# Patient Record
Sex: Male | Born: 1995 | Race: White | Hispanic: No | Marital: Single | Smoking: Never smoker
Health system: Southern US, Community
[De-identification: ages and names within clinical notes are randomized; demographics above are authoritative.]

## PROBLEM LIST (undated history)

## (undated) DIAGNOSIS — Z9889 Other specified postprocedural states: Secondary | ICD-10-CM

## (undated) DIAGNOSIS — R112 Nausea with vomiting, unspecified: Secondary | ICD-10-CM

## (undated) DIAGNOSIS — J45909 Unspecified asthma, uncomplicated: Secondary | ICD-10-CM

## (undated) DIAGNOSIS — H53001 Unspecified amblyopia, right eye: Secondary | ICD-10-CM

## (undated) HISTORY — PX: FRACTURE SURGERY: SHX138

## (undated) HISTORY — PX: EYE SURGERY: SHX253

---
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
## 2013-01-17 ENCOUNTER — Encounter (HOSPITAL_COMMUNITY): Payer: Self-pay | Admitting: Emergency Medicine

## 2013-01-17 ENCOUNTER — Emergency Department (INDEPENDENT_AMBULATORY_CARE_PROVIDER_SITE_OTHER)
Admission: EM | Admit: 2013-01-17 | Discharge: 2013-01-17 | Disposition: A | Discharge status: Home / Self Care | Payer: No Typology Code available for payment source | Source: Home / Self Care | Attending: Family Medicine | Admitting: Family Medicine

## 2013-01-17 DIAGNOSIS — Z760 Encounter for issue of repeat prescription: Secondary | ICD-10-CM

## 2013-01-17 MED ORDER — FLUTICASONE-SALMETEROL 115-21 MCG/ACT IN AERO: 2.0000 | INHALATION_SPRAY | Freq: Two times a day (BID) | RESPIRATORY_TRACT | Status: AC

## 2013-01-17 MED ORDER — ALBUTEROL SULFATE HFA 108 (90 BASE) MCG/ACT IN AERS: 1.0000 | INHALATION_SPRAY | Freq: Four times a day (QID) | RESPIRATORY_TRACT | Status: AC | PRN

## 2013-01-17 NOTE — Discharge Instructions (Signed)
Medication Refill, Emergency Department  We have refilled your medication today as a courtesy to you. It is best for your medical care, however, to take care of getting refills done through your primary caregiver's office. They have your records and can do a better job of follow-up than we can in the emergency department.  On maintenance medications, we often only prescribe enough medications to get you by until you are able to see your regular caregiver. This is a more expensive way to refill medications.  In the future, please plan for refills so that you will not have to use the emergency department for this.  Thank you for your help. Your help allows us to better take care of the daily emergencies that enter our department.  Document Released: 04/09/2003 Document Revised: 03/15/2011 Document Reviewed: 12/21/2004  ExitCare® Patient Information ©2014 [employer].

## 2013-01-17 NOTE — ED Provider Notes (Signed)
CSN: [id number]     Arrival date & time 01/17/13  1632 History   First MD Initiated Contact with Patient 01/17/13 1757     Chief Complaint  Patient presents with  . Medication Refill   (Consider location/radiation/quality/duration/timing/severity/associated sxs/prior Treatment) HPI Comments: Patient presents requesting routine refill of his asthma medications. [facility], however, he states they no longer accept his insurance. Denies any additional health issues. Is a non-smoker. Denies a current asthma exacerbation.  The history is provided by the patient.    History reviewed. No pertinent past medical history. No past surgical history on file. No family history on file. History  Substance Use Topics  . Smoking status: Not on file  . Smokeless tobacco: Not on file  . Alcohol Use: Not on file    Review of Systems  All other systems reviewed and are negative.    Allergies  Review of patient's allergies indicates no known allergies.  Home Medications  No current outpatient prescriptions on file. BP 135/88  Pulse 61  Temp(Src) 98.3 F (36.8 C) (Oral)  Resp 16  SpO2 100% Physical Exam  Nursing note and vitals reviewed. Constitutional: He is oriented to person, place, and time. He appears well-developed and well-nourished. No distress.  HENT:  Head: Normocephalic and atraumatic.  Nose: Nose normal.  Mouth/Throat: Oropharynx is clear and moist.  Eyes: Conjunctivae are normal. Right eye exhibits no discharge. Left eye exhibits no discharge. No scleral icterus.  Neck: Normal range of motion. Neck supple.  Cardiovascular: Normal rate, regular rhythm and normal heart sounds.   Pulmonary/Chest: Effort normal and breath sounds normal.  Abdominal: There is no tenderness.  Musculoskeletal: Normal range of motion.  Lymphadenopathy:    He has no cervical adenopathy.  Neurological: He is alert and oriented to person, place, and time.  Skin:  Skin is warm and dry.  Psychiatric: He has a normal mood and affect. His behavior is normal.    ED Course  Procedures (including critical care time) Labs Review Labs Reviewed - No data to display Imaging Review No results found.  EKG Interpretation    Date/Time:    Ventricular Rate:    PR Interval:    QRS Duration:   QT Interval:    QTC Calculation:   R Axis:     Text Interpretation:              MDM  Visit for medication refill. No acute illness.    Jess BartersJennifer Lee [place] Creek[place], [place] 01/17/13 [phone]

## 2013-01-17 NOTE — ED Notes (Signed)
C/o asthma flare up due to cold weather States he does need a refill on his advair inhaler

## 2013-01-18 NOTE — ED Provider Notes (Signed)
Medical screening examination/treatment/procedure(s) were performed by a resident physician or non-physician practitioner and as the supervising physician I was immediately available for consultation/collaboration.  Okie Bogacz, MD    Verity Gilcrest S Kealan Buchan, MD 01/18/13 0752 

## 2019-01-18 ENCOUNTER — Encounter (HOSPITAL_COMMUNITY): Payer: Self-pay

## 2019-01-18 ENCOUNTER — Ambulatory Visit (HOSPITAL_COMMUNITY)
Admission: EM | Admit: 2019-01-18 | Discharge: 2019-01-18 | Disposition: A | Discharge status: Home / Self Care | Payer: Commercial Managed Care - PPO | Source: Home / Self Care | Attending: Internal Medicine | Admitting: Internal Medicine

## 2019-01-18 ENCOUNTER — Other Ambulatory Visit: Payer: Self-pay

## 2019-01-18 DIAGNOSIS — L03115 Cellulitis of right lower limb: Secondary | ICD-10-CM | POA: Insufficient documentation

## 2019-01-18 DIAGNOSIS — L03315 Cellulitis of perineum: Secondary | ICD-10-CM | POA: Diagnosis not present

## 2019-01-18 DIAGNOSIS — L03119 Cellulitis of unspecified part of limb: Secondary | ICD-10-CM | POA: Diagnosis not present

## 2019-01-18 MED ORDER — HYDROCODONE-ACETAMINOPHEN 5-325 MG PO TABS: 1.0000 | ORAL_TABLET | Freq: Four times a day (QID) | ORAL | 0 refills | Status: DC | PRN

## 2019-01-18 MED ORDER — SULFAMETHOXAZOLE-TRIMETHOPRIM 800-160 MG PO TABS: 1.0000 | ORAL_TABLET | Freq: Two times a day (BID) | ORAL | 0 refills | Status: DC

## 2019-01-18 MED ORDER — LIDOCAINE HCL (PF) 2 % IJ SOLN
INTRAMUSCULAR | Status: AC
  Filled 2019-01-18: qty 5

## 2019-01-18 MED ORDER — IBUPROFEN 800 MG PO TABS: 800.0000 mg | ORAL_TABLET | Freq: Three times a day (TID) | ORAL | 0 refills | Status: AC | PRN

## 2019-01-18 NOTE — ED Provider Notes (Addendum)
Brian Richardson    CSN: [id number] Arrival date & time: 01/18/19  5397      History   Chief Complaint Chief Complaint  Patient presents with  . Knee Infection    HPI Brian Richardson is a 24 y.o. male with history of asthma comes to urgent care with complaint of painful swelling in the right lower thigh which started 5 days ago.  Patient was seen 2 days ago and was prescribed antibiotics (Augmentin) at another urgent care.  Patient symptoms did not improve hence the visit to our urgent care.  Says the pain is throbbing, 10 out of 10, constant, aggravated by movement, no relieving factors and pain.  No radiation of pain.No radiation of pain.  Patient had a fever and was tachycardic at the time of being seen here.    History reviewed. No pertinent past medical history.  Patient Active Problem List   Diagnosis Date Noted  . BRONCHITIS 02/20/2008  . ASTHMA 02/20/2008    History reviewed. No pertinent surgical history.     Home Medications    Prior to Admission medications   Medication Sig Start Date End Date Taking? Authorizing Provider  amoxapine (ASENDIN) 100 MG tablet Take by mouth 2 (two) times daily.   Yes [provider]  albuterol (PROVENTIL HFA;VENTOLIN HFA) 108 (90 BASE) MCG/ACT inhaler Inhale 1-2 puffs into the lungs every 6 (six) hours as needed for wheezing or shortness of breath. 01/17/13   Presson, Audelia Hives, PA  fluticasone-salmeterol (ADVAIR HFA) 673-41 MCG/ACT inhaler Inhale 2 puffs into the lungs 2 (two) times daily. 01/17/13   Presson, Audelia Hives, PA  HYDROcodone-acetaminophen (NORCO/VICODIN) 5-325 MG tablet Take 1 tablet by mouth every 6 (six) hours as needed for up to 5 days for moderate pain. 01/18/19 01/23/19  Chase Picket, MD  ibuprofen (ADVIL) 800 MG tablet Take 1 tablet (800 mg total) by mouth every 8 (eight) hours as needed for fever or moderate pain. 01/18/19   Gunnison Chahal, Myrene Galas, MD  sulfamethoxazole-trimethoprim (BACTRIM DS)  800-160 MG tablet Take 1 tablet by mouth 2 (two) times daily for 7 days. 01/18/19 01/25/19  Chase Picket, MD    Family History Family History  Problem Relation Age of Onset  . Healthy Mother   . Healthy Father     Social History Social History   Tobacco Use  . Smoking status: Never Smoker  . Smokeless tobacco: Never Used  Substance Use Topics  . Alcohol use: Yes  . Drug use: Never     Allergies   Patient has no known allergies.   Review of Systems Review of Systems   Physical Exam Triage Vital Signs ED Triage Vitals  Enc Vitals Group     BP 01/18/19 0828 (!) 150/59     Pulse Rate 01/18/19 0828 (!) 125     Resp 01/18/19 0828 18     Temp 01/18/19 0828 (!) 100.8 F (38.2 C)     Temp Source 01/18/19 0828 Oral     SpO2 01/18/19 0828 96 %     Weight 01/18/19 0824 235 lb 3.2 oz (106.7 kg)     Height --      Head Circumference --      Peak Flow --      Pain Score 01/18/19 0824 8     Pain Loc --      Pain Edu? --      Excl. in [place]? --    No data found.  Updated Vital Signs BP (!) 150/59 (BP Location: Right Arm)   Pulse (!) 125   Temp (!) 100.8 F (38.2 C) (Oral)   Resp 18   Wt 106.7 kg   SpO2 96%   Visual Acuity Right Eye Distance:   Left Eye Distance:   Bilateral Distance:    Right Eye Near:   Left Eye Near:    Bilateral Near:     Physical Exam Constitutional:      General: He is in acute distress.     Appearance: He is ill-appearing.  Cardiovascular:     Rate and Rhythm: Normal rate and regular rhythm.     Pulses: Normal pulses.     Heart sounds: Normal heart sounds.  Pulmonary:     Effort: Pulmonary effort is normal.     Breath sounds: Normal breath sounds.  Abdominal:     General: Bowel sounds are normal.     Palpations: Abdomen is soft.     Tenderness: There is no abdominal tenderness. There is no rebound.     Hernia: No hernia is present.  Musculoskeletal:        General: Normal range of motion.     Comments: Swelling on the  distal lateral aspect of the right thigh.  Patient had no knee joint swelling.  Patella was not ballotable.  Swelling was tender, indurated and had a necrotic roof over an abscess area.  Skin:    General: Skin is warm and dry.     Capillary Refill: Capillary refill takes less than 2 seconds.  Neurological:     General: No focal deficit present.     Mental Status: He is alert and oriented to person, place, and time.      UC Treatments / Results  Labs (all labs ordered are listed, but only abnormal results are displayed) Labs Reviewed  AEROBIC CULTURE (SUPERFICIAL SPECIMEN)    EKG   Radiology No results found.  Procedures Incision and Drainage  Date/Time: 01/19/2019 5:18 PM Performed by: Merrilee Jansky, MD Authorized by: Merrilee Jansky, MD   Consent:    Consent obtained:  Verbal   Consent given by:  Patient   Risks discussed:  Incomplete drainage   Alternatives discussed:  No treatment and delayed treatment Location:    Type:  Abscess   Location:  Lower extremity   Lower extremity location:  Knee   Knee location:  R knee Pre-procedure details:    Skin preparation:  Betadine Anesthesia (see MAR for exact dosages):    Anesthesia method:  Local infiltration   Local anesthetic:  Lidocaine 2% WITH epi Procedure type:    Complexity:  Simple Procedure details:    Incision types:  Single straight   Incision depth:  Subcutaneous   Scalpel blade:  11   Wound management:  Probed and deloculated   Drainage:  Bloody and purulent   Drainage amount:  Moderate   Wound treatment:  Drain placed   Packing materials:  1/4 in iodoform gauze   Amount 1/4" iodoform:  4 Post-procedure details:    Patient tolerance of procedure:  Tolerated well, no immediate complications   (including critical care time)  Medications Ordered in UC Medications - No data to display  Initial Impression / Assessment and Plan / UC Course  I have reviewed the triage vital signs and the nursing  notes.  Pertinent labs & imaging results that were available during my care of the patient were reviewed by me and considered in my  medical decision making (see chart for details).     1.  Cellulitis with abscess lateral aspect of right knee. No knee joint involvement. Incision and drainage yielded bloody/purulent abscess Cultures were sent The duration of his symptoms and the necrotic nature of the abscess area makes me wonder if the primary injury was not an insect bite. Daily wound dressing changes Remove packing per discharge instructions If patient's symptoms worsen he is advised to return to the urgent care to be reevaluated Bactrim double strength 1 tablet twice daily for 7 days Hydrocodone-acetaminophen 1 tablet every 6 hours as needed Ibuprofen 800 mg 1 tablet every 8 hours as needed Final Clinical Impressions(s) / UC Diagnoses   Final diagnoses:  Cellulitis of right knee     Discharge Instructions     1.  Please remove the packing tomorrow 2.  Daily wound dressing changes 3.  Gentle range of motion exercises 4.  If knee pain and swelling gets worse, return to urgent care immediately 5.  After 24 hours you can take a shower with soap and water. Contact with soap and water is ok    ED Prescriptions    Medication Sig Dispense Auth. Provider   sulfamethoxazole-trimethoprim (BACTRIM DS) 800-160 MG tablet Take 1 tablet by mouth 2 (two) times daily for 7 days. 14 tablet Kahli Mayon, Britta Mccreedy, MD   HYDROcodone-acetaminophen (NORCO/VICODIN) 5-325 MG tablet Take 1 tablet by mouth every 6 (six) hours as needed for up to 5 days for moderate pain. 10 tablet Diem Dicocco, Britta Mccreedy, MD   ibuprofen (ADVIL) 800 MG tablet Take 1 tablet (800 mg total) by mouth every 8 (eight) hours as needed for fever or moderate pain. 21 tablet Orvie Caradine, Britta Mccreedy, MD     I have reviewed the PDMP during this encounter.   Merrilee Jansky, MD 01/19/19 1717    Merrilee Jansky, MD 01/19/19 [phone]

## 2019-01-18 NOTE — Discharge Instructions (Addendum)
1.  Please remove the packing tomorrow 2.  Daily wound dressing changes 3.  Gentle range of motion exercises 4.  If knee pain and swelling gets worse, return to urgent care immediately 5.  After 24 hours you can take a shower with soap and water. Contact with soap and water is ok

## 2019-01-18 NOTE — ED Triage Notes (Addendum)
Pt. States he noticed a spot/pain on his Rt. Knee Sunday. He went to the doctor on Tues but the meds prescribed are NOT relieving any pain for him. Wants to be reevaluated.

## 2019-01-19 DIAGNOSIS — L03115 Cellulitis of right lower limb: Secondary | ICD-10-CM | POA: Diagnosis not present

## 2019-01-20 ENCOUNTER — Other Ambulatory Visit: Payer: Self-pay

## 2019-01-20 ENCOUNTER — Inpatient Hospital Stay (HOSPITAL_COMMUNITY)
Admission: EM | Admit: 2019-01-20 | Discharge: 2019-01-24 | DRG: 570 | Disposition: A | Discharge status: Home / Self Care | Payer: Commercial Managed Care - PPO | Attending: Surgery | Admitting: Surgery

## 2019-01-20 ENCOUNTER — Emergency Department (HOSPITAL_COMMUNITY): Payer: Commercial Managed Care - PPO

## 2019-01-20 ENCOUNTER — Encounter (HOSPITAL_COMMUNITY): Payer: Self-pay | Admitting: Emergency Medicine

## 2019-01-20 DIAGNOSIS — L02415 Cutaneous abscess of right lower limb: Secondary | ICD-10-CM | POA: Diagnosis present

## 2019-01-20 DIAGNOSIS — L0231 Cutaneous abscess of buttock: Secondary | ICD-10-CM | POA: Diagnosis present

## 2019-01-20 DIAGNOSIS — L03315 Cellulitis of perineum: Principal | ICD-10-CM | POA: Diagnosis present

## 2019-01-20 DIAGNOSIS — L02215 Cutaneous abscess of perineum: Secondary | ICD-10-CM | POA: Diagnosis present

## 2019-01-20 DIAGNOSIS — Z20822 Contact with and (suspected) exposure to covid-19: Secondary | ICD-10-CM | POA: Diagnosis present

## 2019-01-20 DIAGNOSIS — B9562 Methicillin resistant Staphylococcus aureus infection as the cause of diseases classified elsewhere: Secondary | ICD-10-CM | POA: Diagnosis present

## 2019-01-20 DIAGNOSIS — L03115 Cellulitis of right lower limb: Secondary | ICD-10-CM | POA: Diagnosis present

## 2019-01-20 DIAGNOSIS — J45909 Unspecified asthma, uncomplicated: Secondary | ICD-10-CM | POA: Diagnosis present

## 2019-01-20 DIAGNOSIS — L02419 Cutaneous abscess of limb, unspecified: Secondary | ICD-10-CM

## 2019-01-20 DIAGNOSIS — L03119 Cellulitis of unspecified part of limb: Secondary | ICD-10-CM | POA: Diagnosis present

## 2019-01-20 DIAGNOSIS — A419 Sepsis, unspecified organism: Secondary | ICD-10-CM

## 2019-01-20 DIAGNOSIS — R6520 Severe sepsis without septic shock: Secondary | ICD-10-CM

## 2019-01-20 DIAGNOSIS — M726 Necrotizing fasciitis: Secondary | ICD-10-CM

## 2019-01-20 DIAGNOSIS — Z79899 Other long term (current) drug therapy: Secondary | ICD-10-CM | POA: Diagnosis not present

## 2019-01-20 HISTORY — DX: Nausea with vomiting, unspecified: Z98.890

## 2019-01-20 HISTORY — DX: Necrotizing fasciitis: M72.6

## 2019-01-20 HISTORY — DX: Unspecified amblyopia, right eye: H53.001

## 2019-01-20 HISTORY — DX: Unspecified asthma, uncomplicated: J45.909

## 2019-01-20 HISTORY — DX: Nausea with vomiting, unspecified: R11.2

## 2019-01-20 LAB — CBC WITH DIFFERENTIAL/PLATELET
Abs Immature Granulocytes: 0.36 10*3/uL — ABNORMAL HIGH (ref 0.00–0.07)
Basophils Absolute: 0.2 10*3/uL — ABNORMAL HIGH (ref 0.0–0.1)
Basophils Relative: 1 %
Eosinophils Absolute: 0.1 10*3/uL (ref 0.0–0.5)
Eosinophils Relative: 0 %
HCT: 53.1 % — ABNORMAL HIGH (ref 39.0–52.0)
Hemoglobin: 17.7 g/dL — ABNORMAL HIGH (ref 13.0–17.0)
Immature Granulocytes: 2 %
Lymphocytes Relative: 4 %
Lymphs Abs: 0.9 10*3/uL (ref 0.7–4.0)
MCH: 30.2 pg (ref 26.0–34.0)
MCHC: 33.3 g/dL (ref 30.0–36.0)
MCV: 90.6 fL (ref 80.0–100.0)
Monocytes Absolute: 1.8 10*3/uL — ABNORMAL HIGH (ref 0.1–1.0)
Monocytes Relative: 7 %
Neutro Abs: 21.5 10*3/uL — ABNORMAL HIGH (ref 1.7–7.7)
Neutrophils Relative %: 86 %
Platelets: 324 10*3/uL (ref 150–400)
RBC: 5.86 MIL/uL — ABNORMAL HIGH (ref 4.22–5.81)
RDW: 12.7 % (ref 11.5–15.5)
WBC: 24.7 10*3/uL — ABNORMAL HIGH (ref 4.0–10.5)
nRBC: 0 % (ref 0.0–0.2)

## 2019-01-20 LAB — AEROBIC CULTURE W GRAM STAIN (SUPERFICIAL SPECIMEN): Special Requests: NORMAL

## 2019-01-20 LAB — SYNOVIAL CELL COUNT + DIFF, W/ CRYSTALS
Crystals, Fluid: NONE SEEN
Eosinophils-Synovial: 0 % (ref 0–1)
Lymphocytes-Synovial Fld: 21 % — ABNORMAL HIGH (ref 0–20)
Monocyte-Macrophage-Synovial Fluid: 56 % (ref 50–90)
Neutrophil, Synovial: 23 % (ref 0–25)
WBC, Synovial: 1190 /mm3 — ABNORMAL HIGH (ref 0–200)

## 2019-01-20 LAB — COMPREHENSIVE METABOLIC PANEL
ALT: 28 U/L (ref 0–44)
AST: 31 U/L (ref 15–41)
Albumin: 2.9 g/dL — ABNORMAL LOW (ref 3.5–5.0)
Alkaline Phosphatase: 124 U/L (ref 38–126)
Anion gap: 14 (ref 5–15)
BUN: 13 mg/dL (ref 6–20)
CO2: 26 mmol/L (ref 22–32)
Calcium: 8.9 mg/dL (ref 8.9–10.3)
Chloride: 94 mmol/L — ABNORMAL LOW (ref 98–111)
Creatinine, Ser: 1.39 mg/dL — ABNORMAL HIGH (ref 0.61–1.24)
GFR calc Af Amer: >60 mL/min (ref >60)
GFR calc non Af Amer: >60 mL/min (ref >60)
Glucose, Bld: 105 mg/dL — ABNORMAL HIGH (ref 70–99)
Potassium: 3.8 mmol/L (ref 3.5–5.1)
Sodium: 134 mmol/L — ABNORMAL LOW (ref 135–145)
Total Bilirubin: 1 mg/dL (ref 0.3–1.2)
Total Protein: 7.9 g/dL (ref 6.5–8.1)

## 2019-01-20 LAB — HEMOGLOBIN A1C
Hgb A1c MFr Bld: 5.1 % (ref 4.8–5.6)
Mean Plasma Glucose: 99.67 mg/dL

## 2019-01-20 LAB — SEDIMENTATION RATE: Sed Rate: 80 mm/hr — ABNORMAL HIGH (ref 0–16)

## 2019-01-20 LAB — HIV ANTIBODY (ROUTINE TESTING W REFLEX): HIV Screen 4th Generation wRfx: NONREACTIVE

## 2019-01-20 LAB — C-REACTIVE PROTEIN: CRP: 35.7 mg/dL — ABNORMAL HIGH (ref <1.0)

## 2019-01-20 LAB — RESPIRATORY PANEL BY RT PCR (FLU A&B, COVID)
Influenza A by PCR: NEGATIVE
Influenza B by PCR: NEGATIVE
SARS Coronavirus 2 by RT PCR: NEGATIVE

## 2019-01-20 LAB — PROTIME-INR
INR: 1.1 (ref 0.8–1.2)
Prothrombin Time: 14.2 seconds (ref 11.4–15.2)

## 2019-01-20 LAB — LACTIC ACID, PLASMA
Lactic Acid, Venous: 2.2 mmol/L (ref 0.5–1.9)
Lactic Acid, Venous: 2.9 mmol/L (ref 0.5–1.9)
Lactic Acid, Venous: 2.1 mmol/L (ref 0.5–1.9)

## 2019-01-20 LAB — APTT: aPTT: 29 seconds (ref 24–36)

## 2019-01-20 LAB — CK: Total CK: 91 U/L (ref 49–397)

## 2019-01-20 MED ORDER — VANCOMYCIN HCL 1250 MG/250ML IV SOLN
1250.0000 mg | Freq: Three times a day (TID) | INTRAVENOUS | Status: DC
  Administered 2019-01-20 – 2019-01-24 (×12): 1250 mg via INTRAVENOUS
  Filled 2019-01-20 (×14): qty 250

## 2019-01-20 MED ORDER — ONDANSETRON HCL 4 MG/2ML IJ SOLN
4.0000 mg | Freq: Four times a day (QID) | INTRAMUSCULAR | Status: DC | PRN
  Administered 2019-01-21: 4 mg via INTRAVENOUS

## 2019-01-20 MED ORDER — MORPHINE SULFATE (PF) 4 MG/ML IV SOLN: 4.0000 mg | INTRAVENOUS | Status: DC | PRN

## 2019-01-20 MED ORDER — DOCUSATE SODIUM 100 MG PO CAPS
100.0000 mg | ORAL_CAPSULE | Freq: Two times a day (BID) | ORAL | Status: DC
  Administered 2019-01-20 – 2019-01-24 (×7): 100 mg via ORAL
  Filled 2019-01-20 (×7): qty 1

## 2019-01-20 MED ORDER — IOHEXOL 300 MG/ML  SOLN
100.0000 mL | Freq: Once | INTRAMUSCULAR | Status: AC | PRN
  Administered 2019-01-20: 100 mL via INTRAVENOUS

## 2019-01-20 MED ORDER — GABAPENTIN 300 MG PO CAPS
300.0000 mg | ORAL_CAPSULE | Freq: Once | ORAL | Status: AC
  Administered 2019-01-20: 300 mg via ORAL
  Filled 2019-01-20: qty 1

## 2019-01-20 MED ORDER — ONDANSETRON 4 MG PO TBDP: 4.0000 mg | ORAL_TABLET | Freq: Four times a day (QID) | ORAL | Status: DC | PRN

## 2019-01-20 MED ORDER — CEFAZOLIN SODIUM-DEXTROSE 2-4 GM/100ML-% IV SOLN
2.0000 g | INTRAVENOUS | Status: AC
  Administered 2019-01-21: 2 g via INTRAVENOUS
  Filled 2019-01-20: qty 100

## 2019-01-20 MED ORDER — METHOCARBAMOL 500 MG PO TABS
1000.0000 mg | ORAL_TABLET | Freq: Three times a day (TID) | ORAL | Status: DC
  Administered 2019-01-20 – 2019-01-24 (×12): 1000 mg via ORAL
  Filled 2019-01-20 (×12): qty 2

## 2019-01-20 MED ORDER — ENSURE PRE-SURGERY PO LIQD
296.0000 mL | Freq: Once | ORAL | Status: AC
  Administered 2019-01-20: 296 mL via ORAL
  Filled 2019-01-20: qty 296

## 2019-01-20 MED ORDER — SODIUM CHLORIDE 0.9 % IV BOLUS
1000.0000 mL | Freq: Once | INTRAVENOUS | Status: AC
  Administered 2019-01-20: 1000 mL via INTRAVENOUS

## 2019-01-20 MED ORDER — ACETAMINOPHEN 500 MG PO TABS
1000.0000 mg | ORAL_TABLET | Freq: Four times a day (QID) | ORAL | Status: DC
  Administered 2019-01-20 – 2019-01-24 (×13): 1000 mg via ORAL
  Filled 2019-01-20 (×16): qty 2

## 2019-01-20 MED ORDER — ACETAMINOPHEN 500 MG PO TABS
1000.0000 mg | ORAL_TABLET | Freq: Once | ORAL | Status: AC
  Administered 2019-01-20: 1000 mg via ORAL
  Filled 2019-01-20: qty 2

## 2019-01-20 MED ORDER — OXYCODONE HCL 5 MG PO TABS
5.0000 mg | ORAL_TABLET | ORAL | Status: DC | PRN
  Administered 2019-01-21 – 2019-01-22 (×4): 10 mg via ORAL
  Administered 2019-01-22: 5 mg via ORAL
  Administered 2019-01-22 – 2019-01-23 (×4): 10 mg via ORAL
  Filled 2019-01-20 (×3): qty 2
  Filled 2019-01-20: qty 1
  Filled 2019-01-20 (×5): qty 2

## 2019-01-20 MED ORDER — CLINDAMYCIN PHOSPHATE 900 MG/50ML IV SOLN
900.0000 mg | Freq: Three times a day (TID) | INTRAVENOUS | Status: DC
  Administered 2019-01-21 – 2019-01-22 (×5): 900 mg via INTRAVENOUS
  Filled 2019-01-20 (×6): qty 50

## 2019-01-20 MED ORDER — HYDROMORPHONE HCL 1 MG/ML IJ SOLN
0.5000 mg | Freq: Once | INTRAMUSCULAR | Status: AC
  Administered 2019-01-20: 0.5 mg via INTRAVENOUS
  Filled 2019-01-20: qty 1

## 2019-01-20 MED ORDER — CHLORHEXIDINE GLUCONATE 4 % EX LIQD
60.0000 mL | Freq: Once | CUTANEOUS | Status: AC
  Administered 2019-01-21: 4 via TOPICAL
  Filled 2019-01-20: qty 60

## 2019-01-20 MED ORDER — LACTATED RINGERS IV SOLN: INTRAVENOUS | Status: DC

## 2019-01-20 MED ORDER — PIPERACILLIN-TAZOBACTAM 3.375 G IVPB: 3.3750 g | Freq: Three times a day (TID) | INTRAVENOUS | Status: DC

## 2019-01-20 MED ORDER — PIPERACILLIN-TAZOBACTAM 3.375 G IVPB
3.3750 g | Freq: Three times a day (TID) | INTRAVENOUS | Status: DC
  Administered 2019-01-20 – 2019-01-22 (×5): 3.375 g via INTRAVENOUS
  Filled 2019-01-20 (×5): qty 50

## 2019-01-20 MED ORDER — ONDANSETRON HCL 4 MG/2ML IJ SOLN
4.0000 mg | Freq: Once | INTRAMUSCULAR | Status: AC
  Administered 2019-01-20: 4 mg via INTRAVENOUS
  Filled 2019-01-20: qty 2

## 2019-01-20 MED ORDER — HYDROMORPHONE HCL 1 MG/ML IJ SOLN
0.5000 mg | INTRAMUSCULAR | Status: DC | PRN
  Administered 2019-01-20 – 2019-01-22 (×6): 0.5 mg via INTRAVENOUS
  Filled 2019-01-20 (×6): qty 1

## 2019-01-20 MED ORDER — ENOXAPARIN SODIUM 40 MG/0.4ML ~~LOC~~ SOLN
40.0000 mg | SUBCUTANEOUS | Status: DC
  Administered 2019-01-20 – 2019-01-23 (×4): 40 mg via SUBCUTANEOUS
  Filled 2019-01-20 (×4): qty 0.4

## 2019-01-20 MED ORDER — CLINDAMYCIN PHOSPHATE 600 MG/50ML IV SOLN
600.0000 mg | Freq: Once | INTRAVENOUS | Status: AC
  Administered 2019-01-20: 600 mg via INTRAVENOUS
  Filled 2019-01-20: qty 50

## 2019-01-20 MED ORDER — PIPERACILLIN-TAZOBACTAM 3.375 G IVPB 30 MIN
3.3750 g | Freq: Once | INTRAVENOUS | Status: AC
  Administered 2019-01-20: 3.375 g via INTRAVENOUS
  Filled 2019-01-20: qty 50

## 2019-01-20 NOTE — H&P (Signed)
Reason for Consult: perineal cellulitis Referring Physician: Jermarion Poffenberger is an 24 y.o. male.   HPI: 14M with acute, unprovoked onset of R knee pain, swelling, and erythema. Seen at urgent care, underwent drainage at the knee, started on amoxicillin 1/12, which was changed to bactrim 1/14. Pain has improved at the knee, but now has perineal fullness and tenderness.   History reviewed. No pertinent past medical history.  History reviewed. No pertinent surgical history.  Family History  Problem Relation Age of Onset  . Healthy Mother   . Healthy Father     Social History:  reports that he has never smoked. He has never used smokeless tobacco. He reports current alcohol use. He reports that he does not use drugs.  Allergies: No Known Allergies  Medications: I have reviewed the patient's current medications.  Results for orders placed or performed during the hospital encounter of 01/20/19 (from the past 48 hour(s))  C-reactive protein     Status: Abnormal   Collection Time: 01/20/19  3:26 PM  Result Value Ref Range   CRP 35.7 (H) <1.0 mg/dL    Comment: Performed at [facility], [address], [place], [place] [address]  Lactic acid, plasma     Status: Abnormal   Collection Time: 01/20/19  3:30 PM  Result Value Ref Range   Lactic Acid, Venous 2.2 (HH) 0.5 - 1.9 mmol/L    Comment: CRITICAL RESULT CALLED TO, READ BACK BY AND VERIFIED WITHVela Prose RN AT 7253 01/20/19 BY Miquel Dunn Performed at [facility], [address], [place], [place] [address]   Comprehensive metabolic panel     Status: Abnormal   Collection Time: 01/20/19  3:30 PM  Result Value Ref Range   Sodium 134 (L) 135 - 145 mmol/L   Potassium 3.8 3.5 - 5.1 mmol/L   Chloride 94 (L) 98 - 111 mmol/L   CO2 26 22 - 32 mmol/L   Glucose, Bld 105 (H) 70 - 99 mg/dL   BUN 13 6 - 20 mg/dL   Creatinine, Ser 3.47 (H) 0.61 - 1.24 mg/dL   Calcium 8.9 8.9 - 42.5 mg/dL   Total Protein 7.9 6.5 -  8.1 g/dL   Albumin 2.9 (L) 3.5 - 5.0 g/dL   AST 31 15 - 41 U/L   ALT 28 0 - 44 U/L   Alkaline Phosphatase 124 38 - 126 U/L   Total Bilirubin 1.0 0.3 - 1.2 mg/dL   GFR calc non Af Amer >60 >60 mL/min   GFR calc Af Amer >60 >60 mL/min   Anion gap 14 5 - 15    Comment: Performed at [facility], [address], [place], [place] [address]  CBC WITH DIFFERENTIAL     Status: Abnormal   Collection Time: 01/20/19  3:30 PM  Result Value Ref Range   WBC 24.7 (H) 4.0 - 10.5 K/uL   RBC 5.86 (H) 4.22 - 5.81 MIL/uL   Hemoglobin 17.7 (H) 13.0 - 17.0 g/dL   HCT 75.6 (H) 43.3 - 29.5 %   MCV 90.6 80.0 - 100.0 fL   MCH 30.2 26.0 - 34.0 pg   MCHC 33.3 30.0 - 36.0 g/dL   RDW 18.8 41.6 - 60.6 %   Platelets 324 150 - 400 K/uL   nRBC 0.0 0.0 - 0.2 %   Neutrophils Relative % 86 %   Neutro Abs 21.5 (H) 1.7 - 7.7 K/uL   Lymphocytes Relative 4 %  Lymphs Abs 0.9 0.7 - 4.0 K/uL   Monocytes Relative 7 %   Monocytes Absolute 1.8 (H) 0.1 - 1.0 K/uL   Eosinophils Relative 0 %   Eosinophils Absolute 0.1 0.0 - 0.5 K/uL   Basophils Relative 1 %   Basophils Absolute 0.2 (H) 0.0 - 0.1 K/uL   Immature Granulocytes 2 %   Abs Immature Granulocytes 0.36 (H) 0.00 - 0.07 K/uL    Comment: Performed at [facility], [address] [place], [place] [address]  APTT     Status: None   Collection Time: 01/20/19  3:30 PM  Result Value Ref Range   aPTT 29 24 - 36 seconds    Comment: Performed at [facility], [address] [place], [place] [address]  Protime-INR     Status: None   Collection Time: 01/20/19  3:30 PM  Result Value Ref Range   Prothrombin Time 14.2 11.4 - 15.2 seconds   INR 1.1 0.8 - 1.2    Comment: (NOTE) INR goal varies based on device and disease states. Performed at [facility], [address] [place], [place] [address]   Sedimentation rate     Status: Abnormal   Collection Time: 01/20/19  3:30 PM  Result Value Ref Range   Sed Rate 80 (H) 0 - 16 mm/hr    Comment:  Performed at [facility], [address] [place], [place] [address]  CK     Status: None   Collection Time: 01/20/19  3:30 PM  Result Value Ref Range   Total CK 91 49 - 397 U/L    Comment: Performed at [facility], [address] [place], [place] [address]  Hemoglobin A1c     Status: None   Collection Time: 01/20/19  3:30 PM  Result Value Ref Range   Hgb A1c MFr Bld 5.1 4.8 - 5.6 %    Comment: (NOTE) Pre diabetes:          5.7%-6.4% Diabetes:              >6.4% Glycemic control for   <7.0% adults with diabetes    Mean Plasma Glucose 99.67 mg/dL    Comment: Performed at [facility], [address] [place], [place] [address]  Respiratory Panel by RT PCR (Flu A&B, Covid) - Nasopharyngeal Swab     Status: None   Collection Time: 01/20/19  4:45 PM   Specimen: Nasopharyngeal Swab  Result Value Ref Range   SARS Coronavirus 2 by RT PCR NEGATIVE NEGATIVE    Comment: (NOTE) SARS-CoV-2 target nucleic acids are NOT DETECTED. The SARS-CoV-2 RNA is generally detectable in upper respiratoy specimens during the acute phase of infection. The lowest concentration of SARS-CoV-2 viral copies this assay can detect is 131 copies/mL. A negative result does not preclude SARS-Cov-2 infection and should not be used as the sole basis for treatment or other patient management decisions. A negative result may occur with  improper specimen collection/handling, submission of specimen other than nasopharyngeal swab, presence of viral mutation(s) within the areas targeted by this assay, and inadequate number of viral copies (<131 copies/mL). A negative result must be combined with clinical observations, patient history, and epidemiological information. The expected result is Negative. Fact Sheet for Patients:  https://www.moore.com/https://www.fda.gov/media/142436/download Fact Sheet for Healthcare Providers:  https://www.young.biz/https://www.fda.gov/media/142435/download This test is not yet ap proved or cleared by the [place] FDA and  has been authorized for detection and/or diagnosis of  SARS-CoV-2 by FDA under an Emergency Use Authorization (EUA). This EUA will remain  in effect (meaning this test can be used) for the duration of the COVID-19 declaration under Section 564(b)(1) of the Act, 21 U.S.C. section 360bbb-3(b)(1), unless the authorization is terminated or revoked sooner.    Influenza A by PCR NEGATIVE NEGATIVE   Influenza B by PCR NEGATIVE NEGATIVE    Comment: (NOTE) The Xpert Xpress SARS-CoV-2/FLU/RSV assay is intended as an aid in  the diagnosis of influenza from Nasopharyngeal swab specimens and  should not be used as a sole basis for treatment. Nasal washings and  aspirates are unacceptable for Xpert Xpress SARS-CoV-2/FLU/RSV  testing. Fact Sheet for Patients: PinkCheek.be Fact Sheet for Healthcare Providers: GravelBags.it This test is not yet approved or cleared by the [place] FDA and  has been authorized for detection and/or diagnosis of SARS-CoV-2 by  FDA under an Emergency Use Authorization (EUA). This EUA will remain  in effect (meaning this test can be used) for the duration of the  Covid-19 declaration under Section 564(b)(1) of the Act, 21  U.S.C. section 360bbb-3(b)(1), unless the authorization is  terminated or revoked. Performed at [facility], [place] [address], [place], [place] [address]   Lactic acid, plasma     Status: Abnormal   Collection Time: 01/20/19  5:57 PM  Result Value Ref Range   Lactic Acid, Venous 2.9 (HH) 0.5 - 1.9 mmol/L    Comment: CRITICAL VALUE NOTED.  VALUE IS CONSISTENT WITH PREVIOUSLY REPORTED AND CALLED VALUE. Performed at [facility], [place] [address] [place], [place] [address]   Synovial cell count + diff, w/ crystals     Status: Abnormal   Collection Time: 01/20/19  7:00 PM  Result Value Ref Range   Color, Synovial AMBER (A) YELLOW   Appearance-Synovial CLOUDY (A)  CLEAR   Crystals, Fluid NO CRYSTALS SEEN    WBC, Synovial 1,190 (H) 0 - 200 /cu mm   Neutrophil, Synovial 23 0 - 25 %   Lymphocytes-Synovial Fld 21 (H) 0 - 20 %   Monocyte-Macrophage-Synovial Fluid 56 50 - 90 %   Eosinophils-Synovial 0 0 - 1 %    Comment: Performed at [place] [address] [place], [place] [address]  Lactic acid, plasma     Status: Abnormal   Collection Time: 01/20/19  7:50 PM  Result Value Ref Range   Lactic Acid, Venous 2.1 (HH) 0.5 - 1.9 mmol/L    Comment: CRITICAL VALUE NOTED.  VALUE IS CONSISTENT WITH PREVIOUSLY REPORTED AND CALLED VALUE. Performed at [facility], [place] [address], [place], [place] [address]     CT FEMUR RIGHT W CONTRAST  Result Date: 01/20/2019 CLINICAL DATA:  Right thigh swelling EXAM: CT OF THE LOWER RIGHT EXTREMITY WITH CONTRAST TECHNIQUE: Multidetector CT imaging of the lower right extremity was performed according to the standard protocol following intravenous contrast administration. COMPARISON:  X-ray 01/20/2019 CONTRAST:  137mL OMNIPAQUE IOHEXOL 300 MG/ML  SOLN FINDINGS: Bones/Joint/Cartilage No acute fracture or dislocation. No periostitis or cortical destruction. No lytic or sclerotic bony lesion. Joint spaces of the right hip, right knee, SI joints, and pubic symphysis are maintained without evidence of arthropathy. No appreciable hip joint effusion. No knee joint effusion. Ligaments Suboptimally assessed by CT. Muscles and Tendons Normal muscle bulk without atrophy or fatty infiltration. No intramuscular fluid collection or evidence of intramuscular abscess. Tendinous structures about the lower extremity appear intact within the limitations of CT. Soft  tissues There is extensive induration and ill-defined fluid density within the soft tissues of the right perineum and inferior gluteal region measuring approximately 12 x 4.5 x 9 cm (series 5, image 123; series 9, image 99). There is no well-defined or rim enhancing wall. No  associated soft tissue gas. There is overlying skin thickening. Inflammatory changes within this region do not appear to involve scrotal soft tissues. Overlying the anterolateral aspect of the distal femur just proximal to the knee, there is extensive skin thickening with underlying induration within the subcutaneous soft tissues. Extensive fluid accumulates within the subcutaneous tissues of the mid to distal femur, which is most pronounced anterior laterally. There is 1 tiny focus of low attenuation seen within the soft tissues adjacent to the lateral femoral condyle (series 5, image 298) which could reflect a small focus of noninflamed normal fat versus a tiny locule of gas. There are somewhat elongated areas of increased attenuation within this region at the level of the distal femoral metaphysis (series 5, image 285; series 9, image 53). There is no appreciable deep fascial fluid collections. IMPRESSION: 1. Extensive subcutaneous inflammatory changes with edema and fluid accumulating within the subcutaneous tissues of the mid to distal right femur, most pronounced anterolaterally. No drainable or rim enhancing fluid component. There is one tiny focus of low attenuation seen within the soft tissues adjacent to the lateral femoral condyle, which could reflect a small focus of non-inflamed normal fat versus a tiny locule of gas. Overall, findings are suggestive of an aggressive soft tissue infection. 2. There are somewhat elongated areas of increased attenuation within this region laterally at the level of the distal femoral metaphysis possibly representing superficial thrombophlebitis. 3. Extensive induration and ill-defined fluid density within the soft tissues of the right perineum and inferior gluteal region measuring approximately 12 x 4.5 x 9 cm without well-defined or rim enhancing wall, suggestive of phlegmonous change. No associated soft tissue gas. 4. There are no appreciable deep fascial fluid  collections. Please note that necrotizing fasciitis is a clinical diagnosis and cannot be excluded by imaging alone. These results were called by telephone at the time of interpretation on 01/20/2019 at 5:55 pm to provider [facility] , who verbally acknowledged these results. Electronically Signed   By: Duanne Guess D.O.   On: 01/20/2019 17:58   DG Femur Min 2 Views Right  Result Date: 01/20/2019 CLINICAL DATA:  Right femoral pain. EXAM: RIGHT FEMUR 2 VIEWS COMPARISON:  None. FINDINGS: There is no evidence of fracture or other focal bone lesions. No evidence of soft tissue emphysema. Diffuse subcutaneous soft tissue swelling noted. IMPRESSION: No acute fracture or dislocation identified about the right femur. Electronically Signed   By: Ted Mcalpine M.D.   On: 01/20/2019 16:01    ROS 10 point review of systems is negative except as listed above in HPI.   Physical Exam Blood pressure (!) 118/45, pulse (!) 116, temperature 97.8 F (36.6 C), temperature source Oral, resp. rate 20, height 6' (1.829 m), weight 106.6 kg, SpO2 98 %.  Gen: comfortable, no distress Neuro: non-focal exam HEENT: PERRL Neck: supple CV: RRR Pulm: unlabored breathing Abd: soft, NT GU: clear yellow urine Extr: wwp, RLE dressed at the knee, erythema to proximal thigh, area of normal appearing tissue, then area of induration, TTP and fullness at R perineum with extension onto R buttock     Assessment/Plan: 3M with R knee and R perineal swelling, erythema, and tenderness.   Concern for NSTI, however patient  is clinically non-toxic and hemodynamics have improved since arrival, antibiotics, and fluid resuscitation. Unclear which location is the source, but based on history, sounds like R knee was primary and potentially seeded the R perineum. Will continue abx, short interval trend of LA, and plan for combination operative incision and possible debridement of R perineum with gen surg and R knee I&D by ortho as  first case.     Diamantina Monks, MD General and Trauma Surgery [facility]

## 2019-01-20 NOTE — ED Triage Notes (Signed)
C/o R knee pain and swelling.  States he had a spot on R knee on Sunday that was thought to be an insect bite.  Went to Dr. On Tuesday and Thursday.  Taking antibiotic without improvement.  Denies fever at present but states he had one a few days ago.

## 2019-01-20 NOTE — Progress Notes (Addendum)
Pharmacy Antibiotic Note  UNIQUE Brian Richardson is a 24 y.o. male admitted on 01/20/2019 with cellulitis.  Pharmacy has been consulted for zosyn and Vancomycin dosing.  Height: 6' (182.9 cm) Weight: 235 lb (106.6 kg) IBW/kg (Calculated) : 77.6  Temp (24hrs), Avg:97.8 F (36.6 C), Min:97.8 F (36.6 C), Max:97.8 F (36.6 C)  No results for input(s): WBC, CREATININE, LATICACIDVEN, VANCOTROUGH, VANCOPEAK, VANCORANDOM, GENTTROUGH, GENTPEAK, GENTRANDOM, TOBRATROUGH, TOBRAPEAK, TOBRARND, AMIKACINPEAK, AMIKACINTROU, AMIKACIN in the last 168 hours.  CrCl cannot be calculated (Patient's most recent lab result is older than the maximum 21 days allowed.).    No Known Allergies  Antimicrobials this admission: 1/16 Zosyn >> 1/16 Clindamycin >> 1/16 Vancomycin >>     Dose adjustments this admission:   Microbiology results: 1/16 BCx: Pending  Plan:  - Start Zosyn 3.375g IV x 1 dose infused over 30 min followed by Zosyn 3.375g IV q8h infused over 4 hours  - No vancomycin loading dose indicated for cellulitis  - Start Vancomycin 1250mg  IV q8h - Est calc AUC 524 - Monitor patients renal function   Thank you for allowing pharmacy to be a part of this patient's care.  PharmD. BCPS 01/20/2019 3:28 PM

## 2019-01-20 NOTE — Progress Notes (Signed)
Notified bedside nurse of need to draw repeat lactic acid. 

## 2019-01-20 NOTE — ED Notes (Signed)
ED TO INPATIENT HANDOFF REPORT  ED Nurse Name and Phone #: Percival Spanish [phone]  S Name/Age/Gender Brian Richardson 24 y.o. male Room/Bed: 024C/024C  Code Status   Code Status: Full Code  Home/SNF/Other Home Patient oriented to: self, place, time and situation Is this baseline? Yes   Triage Complete: Triage complete  Chief Complaint Soft tissue infection [L08.9]  Triage Note C/o R knee pain and swelling.  States he had a spot on R knee on Sunday that was thought to be an insect bite.  Went to Dr. On Tuesday and Thursday.  Taking antibiotic without improvement.  Denies fever at present but states he had one a few days ago.    Allergies No Known Allergies  Level of Care/Admitting Diagnosis ED Disposition    ED Disposition Condition Comment   Admit  Hospital Area: [facility] [[id number]]  Level of Care: Progressive [102]  Admit to Progressive based on following criteria: Other see comments  Comments: ID  Covid Evaluation: Asymptomatic Screening Protocol (No Symptoms)  Diagnosis: Soft tissue infection [342004]  Admitting Physician: CCS, MD [[id number]]  Attending Physician: CCS, MD [[id number]]  Estimated length of stay: 3 - 4 days  Certification:: I certify this patient will need inpatient services for at least 2 midnights       B Medical/Surgery History History reviewed. No pertinent past medical history. History reviewed. No pertinent surgical history.   A IV Location/Drains/Wounds Patient Lines/Drains/Airways Status   Active Line/Drains/Airways    Name:   Placement date:   Placement time:   Site:   Days:   Peripheral IV 01/20/19 Right Antecubital   01/20/19    1530    Antecubital   less than 1   Peripheral IV 01/20/19 Left Forearm   01/20/19    1943    Forearm   less than 1          Intake/Output Last 24 hours  Intake/Output Summary (Last 24 hours) at 01/20/2019 2030 Last data filed at 01/20/2019 1953 Gross per 24 hour  Intake 3250 ml  Output --  Net 3250  ml    Labs/Imaging Results for orders placed or performed during the hospital encounter of 01/20/19 (from the past 48 hour(s))  C-reactive protein     Status: Abnormal   Collection Time: 01/20/19  3:26 PM  Result Value Ref Range   CRP 35.7 (H) <1.0 mg/dL    Comment: Performed at [facility], [address] [place], [place] [address]  Lactic acid, plasma     Status: Abnormal   Collection Time: 01/20/19  3:30 PM  Result Value Ref Range   Lactic Acid, Venous 2.2 (HH) 0.5 - 1.9 mmol/L    Comment: CRITICAL RESULT CALLED TO, READ BACK BY AND VERIFIED WITHVela Prose RN AT 1829 01/20/19 BY Miquel Dunn Performed at [facility], [address], [place], [place] [address]   Comprehensive metabolic panel     Status: Abnormal   Collection Time: 01/20/19  3:30 PM  Result Value Ref Range   Sodium 134 (L) 135 - 145 mmol/L   Potassium 3.8 3.5 - 5.1 mmol/L   Chloride 94 (L) 98 - 111 mmol/L   CO2 26 22 - 32 mmol/L   Glucose, Bld 105 (H) 70 - 99 mg/dL   BUN 13 6 - 20 mg/dL   Creatinine, Ser 9.67 (H) 0.61 - 1.24 mg/dL   Calcium 8.9 8.9 - 89.3 mg/dL   Total Protein 7.9 6.5 - 8.1 g/dL  Albumin 2.9 (L) 3.5 - 5.0 g/dL   AST 31 15 - 41 U/L   ALT 28 0 - 44 U/L   Alkaline Phosphatase 124 38 - 126 U/L   Total Bilirubin 1.0 0.3 - 1.2 mg/dL   GFR calc non Af Amer >60 >60 mL/min   GFR calc Af Amer >60 >60 mL/min   Anion gap 14 5 - 15    Comment: Performed at [facility], [address] [place], [place] [address]  CBC WITH DIFFERENTIAL     Status: Abnormal   Collection Time: 01/20/19  3:30 PM  Result Value Ref Range   WBC 24.7 (H) 4.0 - 10.5 K/uL   RBC 5.86 (H) 4.22 - 5.81 MIL/uL   Hemoglobin 17.7 (H) 13.0 - 17.0 g/dL   HCT 77.9 (H) 39.0 - 30.0 %   MCV 90.6 80.0 - 100.0 fL   MCH 30.2 26.0 - 34.0 pg   MCHC 33.3 30.0 - 36.0 g/dL   RDW 92.3 30.0 - 76.2 %   Platelets 324 150 - 400 K/uL   nRBC 0.0 0.0 - 0.2 %   Neutrophils Relative % 86 %   Neutro Abs 21.5 (H) 1.7 - 7.7 K/uL    Lymphocytes Relative 4 %   Lymphs Abs 0.9 0.7 - 4.0 K/uL   Monocytes Relative 7 %   Monocytes Absolute 1.8 (H) 0.1 - 1.0 K/uL   Eosinophils Relative 0 %   Eosinophils Absolute 0.1 0.0 - 0.5 K/uL   Basophils Relative 1 %   Basophils Absolute 0.2 (H) 0.0 - 0.1 K/uL   Immature Granulocytes 2 %   Abs Immature Granulocytes 0.36 (H) 0.00 - 0.07 K/uL    Comment: Performed at [facility], [address] [place], [place] [address]  APTT     Status: None   Collection Time: 01/20/19  3:30 PM  Result Value Ref Range   aPTT 29 24 - 36 seconds    Comment: Performed at [facility], [address], [place], [place] [address]  Protime-INR     Status: None   Collection Time: 01/20/19  3:30 PM  Result Value Ref Range   Prothrombin Time 14.2 11.4 - 15.2 seconds   INR 1.1 0.8 - 1.2    Comment: (NOTE) INR goal varies based on device and disease states. Performed at [facility], [address] [place], [place] [address]   Sedimentation rate     Status: Abnormal   Collection Time: 01/20/19  3:30 PM  Result Value Ref Range   Sed Rate 80 (H) 0 - 16 mm/hr    Comment: Performed at [facility], [address], [place], [place] [address]  CK     Status: None   Collection Time: 01/20/19  3:30 PM  Result Value Ref Range   Total CK 91 49 - 397 U/L    Comment: Performed at [facility], [address] [place], [place] [address]  Hemoglobin A1c     Status: None   Collection Time: 01/20/19  3:30 PM  Result Value Ref Range   Hgb A1c MFr Bld 5.1 4.8 - 5.6 %    Comment: (NOTE) Pre diabetes:          5.7%-6.4% Diabetes:              >6.4% Glycemic control for   <7.0% adults with diabetes    Mean Plasma Glucose 99.67 mg/dL    Comment: Performed  at [facility], [address], [place], [place] [address]  Respiratory Panel by RT PCR (Flu A&B, Covid) - Nasopharyngeal Swab     Status: None   Collection Time: 01/20/19  4:45 PM   Specimen: Nasopharyngeal Swab   Result Value Ref Range   SARS Coronavirus 2 by RT PCR NEGATIVE NEGATIVE    Comment: (NOTE) SARS-CoV-2 target nucleic acids are NOT DETECTED. The SARS-CoV-2 RNA is generally detectable in upper respiratoy specimens during the acute phase of infection. The lowest concentration of SARS-CoV-2 viral copies this assay can detect is 131 copies/mL. A negative result does not preclude SARS-Cov-2 infection and should not be used as the sole basis for treatment or other patient management decisions. A negative result may occur with  improper specimen collection/handling, submission of specimen other than nasopharyngeal swab, presence of viral mutation(s) within the areas targeted by this assay, and inadequate number of viral copies (<131 copies/mL). A negative result must be combined with clinical observations, patient history, and epidemiological information. The expected result is Negative. Fact Sheet for Patients:  https://www.moore.com/ Fact Sheet for Healthcare Providers:  https://www.young.biz/ This test is not yet ap proved or cleared by the [place] FDA and  has been authorized for detection and/or diagnosis of SARS-CoV-2 by FDA under an Emergency Use Authorization (EUA). This EUA will remain  in effect (meaning this test can be used) for the duration of the COVID-19 declaration under Section 564(b)(1) of the Act, 21 U.S.C. section 360bbb-3(b)(1), unless the authorization is terminated or revoked sooner.    Influenza A by PCR NEGATIVE NEGATIVE   Influenza B by PCR NEGATIVE NEGATIVE    Comment: (NOTE) The Xpert Xpress SARS-CoV-2/FLU/RSV assay is intended as an aid in  the diagnosis of influenza from Nasopharyngeal swab specimens and  should not be used as a sole basis for treatment. Nasal washings and  aspirates are unacceptable for Xpert Xpress SARS-CoV-2/FLU/RSV  testing. Fact Sheet for  Patients: https://www.moore.com/ Fact Sheet for Healthcare Providers: https://www.young.biz/ This test is not yet approved or cleared by the [place] FDA and  has been authorized for detection and/or diagnosis of SARS-CoV-2 by  FDA under an Emergency Use Authorization (EUA). This EUA will remain  in effect (meaning this test can be used) for the duration of the  Covid-19 declaration under Section 564(b)(1) of the Act, 21  U.S.C. section 360bbb-3(b)(1), unless the authorization is  terminated or revoked. Performed at [facility], [address], [place], [place] [address]   Lactic acid, plasma     Status: Abnormal   Collection Time: 01/20/19  5:57 PM  Result Value Ref Range   Lactic Acid, Venous 2.9 (HH) 0.5 - 1.9 mmol/L    Comment: CRITICAL VALUE NOTED.  VALUE IS CONSISTENT WITH PREVIOUSLY REPORTED AND CALLED VALUE. Performed at [facility], [address], [place], [place] [address]   Synovial cell count + diff, w/ crystals     Status: Abnormal   Collection Time: 01/20/19  7:00 PM  Result Value Ref Range   Color, Synovial AMBER (A) YELLOW   Appearance-Synovial CLOUDY (A) CLEAR   Crystals, Fluid NO CRYSTALS SEEN    WBC, Synovial 1,190 (H) 0 - 200 /cu mm   Neutrophil, Synovial 23 0 - 25 %   Lymphocytes-Synovial Fld 21 (H) 0 - 20 %   Monocyte-Macrophage-Synovial Fluid 56 50 - 90 %   Eosinophils-Synovial 0 0 - 1 %    Comment: Performed at [facility],  [address] [place], [place] [address]  Lactic acid, plasma     Status: Abnormal   Collection Time: 01/20/19  7:50 PM  Result Value Ref Range   Lactic Acid, Venous 2.1 (HH) 0.5 - 1.9 mmol/L    Comment: CRITICAL VALUE NOTED.  VALUE IS CONSISTENT WITH PREVIOUSLY REPORTED AND CALLED VALUE. Performed at [facility], [address], [place], [place] [address]    CT FEMUR RIGHT W CONTRAST  Result Date: 01/20/2019 CLINICAL DATA:  Right thigh swelling EXAM: CT OF  THE LOWER RIGHT EXTREMITY WITH CONTRAST TECHNIQUE: Multidetector CT imaging of the lower right extremity was performed according to the standard protocol following intravenous contrast administration. COMPARISON:  X-ray 01/20/2019 CONTRAST:  12mL OMNIPAQUE IOHEXOL 300 MG/ML  SOLN FINDINGS: Bones/Joint/Cartilage No acute fracture or dislocation. No periostitis or cortical destruction. No lytic or sclerotic bony lesion. Joint spaces of the right hip, right knee, SI joints, and pubic symphysis are maintained without evidence of arthropathy. No appreciable hip joint effusion. No knee joint effusion. Ligaments Suboptimally assessed by CT. Muscles and Tendons Normal muscle bulk without atrophy or fatty infiltration. No intramuscular fluid collection or evidence of intramuscular abscess. Tendinous structures about the lower extremity appear intact within the limitations of CT. Soft tissues There is extensive induration and ill-defined fluid density within the soft tissues of the right perineum and inferior gluteal region measuring approximately 12 x 4.5 x 9 cm (series 5, image 123; series 9, image 99). There is no well-defined or rim enhancing wall. No associated soft tissue gas. There is overlying skin thickening. Inflammatory changes within this region do not appear to involve scrotal soft tissues. Overlying the anterolateral aspect of the distal femur just proximal to the knee, there is extensive skin thickening with underlying induration within the subcutaneous soft tissues. Extensive fluid accumulates within the subcutaneous tissues of the mid to distal femur, which is most pronounced anterior laterally. There is 1 tiny focus of low attenuation seen within the soft tissues adjacent to the lateral femoral condyle (series 5, image 298) which could reflect a small focus of noninflamed normal fat versus a tiny locule of gas. There are somewhat elongated areas of increased attenuation within this region at the level of  the distal femoral metaphysis (series 5, image 285; series 9, image 53). There is no appreciable deep fascial fluid collections. IMPRESSION: 1. Extensive subcutaneous inflammatory changes with edema and fluid accumulating within the subcutaneous tissues of the mid to distal right femur, most pronounced anterolaterally. No drainable or rim enhancing fluid component. There is one tiny focus of low attenuation seen within the soft tissues adjacent to the lateral femoral condyle, which could reflect a small focus of non-inflamed normal fat versus a tiny locule of gas. Overall, findings are suggestive of an aggressive soft tissue infection. 2. There are somewhat elongated areas of increased attenuation within this region laterally at the level of the distal femoral metaphysis possibly representing superficial thrombophlebitis. 3. Extensive induration and ill-defined fluid density within the soft tissues of the right perineum and inferior gluteal region measuring approximately 12 x 4.5 x 9 cm without well-defined or rim enhancing wall, suggestive of phlegmonous change. No associated soft tissue gas. 4. There are no appreciable deep fascial fluid collections. Please note that necrotizing fasciitis is a clinical diagnosis and cannot be excluded by imaging alone. These results were called by telephone at the time of interpretation on 01/20/2019 at 5:55 pm to provider [facility] , who verbally acknowledged these results.  Electronically Signed   By: Duanne GuessNicholas  Plundo D.O.   On: 01/20/2019 17:58   DG Femur Min 2 Views Right  Result Date: 01/20/2019 CLINICAL DATA:  Right femoral pain. EXAM: RIGHT FEMUR 2 VIEWS COMPARISON:  None. FINDINGS: There is no evidence of fracture or other focal bone lesions. No evidence of soft tissue emphysema. Diffuse subcutaneous soft tissue swelling noted. IMPRESSION: No acute fracture or dislocation identified about the right femur. Electronically Signed   By: Ted Mcalpineobrinka  Dimitrova M.D.   On:  01/20/2019 16:01    Pending Labs Unresulted Labs (From admission, onward)    Start     Ordered   01/20/19 1944  HIV Antibody (routine testing w rflx)  (HIV Antibody (Routine testing w reflex) panel)  Once,   STAT     01/20/19 1951   01/20/19 1918  Stat Gram stain  Once,   STAT     01/20/19 1918   01/20/19 1515  Blood Culture (routine x 2)  BLOOD CULTURE X 2,   STAT     01/20/19 1519          Vitals/Pain Today's Vitals   01/20/19 1630 01/20/19 1642 01/20/19 1750 01/20/19 1900  BP:  (!) 122/54  (!) 118/45  Pulse:  (!) 110  (!) 116  Resp:  18  20  Temp:      TempSrc:      SpO2:  98%  98%  Weight:      Height:      PainSc: 7   8      Isolation Precautions No active isolations  Medications Medications  vancomycin (VANCOREADY) IVPB 1250 mg/250 mL (0 mg Intravenous Stopped 01/20/19 1942)  piperacillin-tazobactam (ZOSYN) IVPB 3.375 g (0 g Intravenous Stopped 01/20/19 1730)    Followed by  piperacillin-tazobactam (ZOSYN) IVPB 3.375 g (has no administration in time range)  lactated ringers infusion ( Intravenous New Bag/Given 01/20/19 1947)  HYDROmorphone (DILAUDID) injection 0.5 mg (0.5 mg Intravenous Given 01/20/19 1943)  enoxaparin (LOVENOX) injection 40 mg (has no administration in time range)  acetaminophen (TYLENOL) tablet 1,000 mg (has no administration in time range)  methocarbamol (ROBAXIN) tablet 1,000 mg (has no administration in time range)  oxyCODONE (Oxy IR/ROXICODONE) immediate release tablet 5-10 mg (has no administration in time range)  morphine 4 MG/ML injection 4 mg (has no administration in time range)  ondansetron (ZOFRAN-ODT) disintegrating tablet 4 mg (has no administration in time range)    Or  ondansetron (ZOFRAN) injection 4 mg (has no administration in time range)  docusate sodium (COLACE) capsule 100 mg (has no administration in time range)  clindamycin (CLEOCIN) IVPB 900 mg (has no administration in time range)  sodium chloride 0.9 % bolus 1,000 mL  (0 mLs Intravenous Stopped 01/20/19 1730)  HYDROmorphone (DILAUDID) injection 0.5 mg (0.5 mg Intravenous Given 01/20/19 1539)  ondansetron (ZOFRAN) injection 4 mg (4 mg Intravenous Given 01/20/19 1538)  clindamycin (CLEOCIN) IVPB 600 mg (0 mg Intravenous Stopped 01/20/19 1730)  sodium chloride 0.9 % bolus 1,000 mL (0 mLs Intravenous Stopped 01/20/19 1730)  sodium chloride 0.9 % bolus 1,000 mL (0 mLs Intravenous Stopped 01/20/19 1953)  HYDROmorphone (DILAUDID) injection 0.5 mg (0.5 mg Intravenous Given 01/20/19 1638)  iohexol (OMNIPAQUE) 300 MG/ML solution 100 mL (100 mLs Intravenous Contrast Given 01/20/19 1713)    Mobility walks Low fall risk   Focused Assessments    R Recommendations: See Admitting Provider Note  Report given to:   Additional Notes:

## 2019-01-20 NOTE — Consult Note (Addendum)
ORTHOPAEDIC CONSULTATION  REQUESTING PHYSICIAN: Eber Hong, MD  Chief Complaint: Right leg pain, abscess  HPI: Brian Richardson is a 24 y.o. male who presented to the emergency room today with increasing pain and swelling right lower extremity into his groin.  Previously seen at [facility] urgent care where a small abscess right lateral knee was drained and cultured.  This grew MRSA.  He has been on Septra.  Some subjective fever midweek, but none in the last few days.  The area continued to drain, he had increasing redness and pain.  He was ill-appearing and tachycardic on arrival.  Code sepsis was initiated by ED providers.  CT scan was ordered and orthopedics was consulted for evaluation.  Dr. Dion Saucier discussed case with general surgery given groin Induration and firmness.  CT scan right femur did not show deep fluid collections or soft tissue gas.  There was extensive subcutaneous edema throughout the distal right femur.  The patient was evaluated at bedside by me and Dr. Dion Saucier.  He reports mostly lateral pain.  He denies distal numbness or paresthesia.  He reports no chronic medical conditions.  He had some sports drink at around 2 PM today.  No known allergies.   History reviewed. No pertinent past medical history. History reviewed. No pertinent surgical history. Social History   Socioeconomic History   Marital status: Single    Spouse name: Not on file   Number of children: Not on file   Years of education: Not on file   Highest education level: Not on file  Occupational History   Not on file  Tobacco Use   Smoking status: Never Smoker   Smokeless tobacco: Never Used  Substance and Sexual Activity   Alcohol use: Yes   Drug use: Never   Sexual activity: Yes  Other Topics Concern   Not on file  Social History Narrative   Not on file   Social Determinants of Health   Financial Resource Strain:    Difficulty of Paying Living Expenses: Not on file  Food  Insecurity:    Worried About Running Out of Food in the Last Year: Not on file   Ran Out of Food in the Last Year: Not on file  Transportation Needs:    Lack of Transportation (Medical): Not on file   Lack of Transportation (Non-Medical): Not on file  Physical Activity:    Days of Exercise per Week: Not on file   Minutes of Exercise per Session: Not on file  Stress:    Feeling of Stress : Not on file  Social Connections:    Frequency of Communication with Friends and Family: Not on file   Frequency of Social Gatherings with Friends and Family: Not on file   Attends Religious Services: Not on file   Active Member of Clubs or Organizations: Not on file   Attends Banker Meetings: Not on file   Marital Status: Not on file   Family History  Problem Relation Age of Onset   Healthy Mother    Healthy Father    No Known Allergies   Prior to Admission medications   Medication Sig Start Date End Date Taking? Authorizing Provider  albuterol (PROVENTIL HFA;VENTOLIN HFA) 108 (90 BASE) MCG/ACT inhaler Inhale 1-2 puffs into the lungs every 6 (six) hours as needed for wheezing or shortness of breath. 01/17/13  Yes Presson, Jess Barters H, PA  fluticasone-salmeterol (ADVAIR HFA) 115-21 MCG/ACT inhaler Inhale 2 puffs into the lungs 2 (  two) times daily. 01/17/13  Yes Presson, Annett Gula H, PA  HYDROcodone-acetaminophen (NORCO/VICODIN) 5-325 MG tablet Take 1 tablet by mouth every 6 (six) hours as needed for up to 5 days for moderate pain. 01/18/19 01/23/19 Yes Lamptey, Myrene Galas, MD  ibuprofen (ADVIL) 800 MG tablet Take 1 tablet (800 mg total) by mouth every 8 (eight) hours as needed for fever or moderate pain. 01/18/19  Yes Lamptey, Myrene Galas, MD  sulfamethoxazole-trimethoprim (BACTRIM DS) 800-160 MG tablet Take 1 tablet by mouth 2 (two) times daily for 7 days. 01/18/19 01/25/19 Yes Lamptey, Myrene Galas, MD  amoxicillin-clavulanate (AUGMENTIN) 875-125 MG tablet Take 1 tablet by mouth  2 (two) times daily. 01/16/19   [provider]   CT FEMUR RIGHT W CONTRAST  Result Date: 01/20/2019 CLINICAL DATA:  Right thigh swelling EXAM: CT OF THE LOWER RIGHT EXTREMITY WITH CONTRAST TECHNIQUE: Multidetector CT imaging of the lower right extremity was performed according to the standard protocol following intravenous contrast administration. COMPARISON:  X-ray 01/20/2019 CONTRAST:  145mL OMNIPAQUE IOHEXOL 300 MG/ML  SOLN FINDINGS: Bones/Joint/Cartilage No acute fracture or dislocation. No periostitis or cortical destruction. No lytic or sclerotic bony lesion. Joint spaces of the right hip, right knee, SI joints, and pubic symphysis are maintained without evidence of arthropathy. No appreciable hip joint effusion. No knee joint effusion. Ligaments Suboptimally assessed by CT. Muscles and Tendons Normal muscle bulk without atrophy or fatty infiltration. No intramuscular fluid collection or evidence of intramuscular abscess. Tendinous structures about the lower extremity appear intact within the limitations of CT. Soft tissues There is extensive induration and ill-defined fluid density within the soft tissues of the right perineum and inferior gluteal region measuring approximately 12 x 4.5 x 9 cm (series 5, image 123; series 9, image 99). There is no well-defined or rim enhancing wall. No associated soft tissue gas. There is overlying skin thickening. Inflammatory changes within this region do not appear to involve scrotal soft tissues. Overlying the anterolateral aspect of the distal femur just proximal to the knee, there is extensive skin thickening with underlying induration within the subcutaneous soft tissues. Extensive fluid accumulates within the subcutaneous tissues of the mid to distal femur, which is most pronounced anterior laterally. There is 1 tiny focus of low attenuation seen within the soft tissues adjacent to the lateral femoral condyle (series 5, image 298) which could reflect a  small focus of noninflamed normal fat versus a tiny locule of gas. There are somewhat elongated areas of increased attenuation within this region at the level of the distal femoral metaphysis (series 5, image 285; series 9, image 53). There is no appreciable deep fascial fluid collections. IMPRESSION: 1. Extensive subcutaneous inflammatory changes with edema and fluid accumulating within the subcutaneous tissues of the mid to distal right femur, most pronounced anterolaterally. No drainable or rim enhancing fluid component. There is one tiny focus of low attenuation seen within the soft tissues adjacent to the lateral femoral condyle, which could reflect a small focus of non-inflamed normal fat versus a tiny locule of gas. Overall, findings are suggestive of an aggressive soft tissue infection. 2. There are somewhat elongated areas of increased attenuation within this region laterally at the level of the distal femoral metaphysis possibly representing superficial thrombophlebitis. 3. Extensive induration and ill-defined fluid density within the soft tissues of the right perineum and inferior gluteal region measuring approximately 12 x 4.5 x 9 cm without well-defined or rim enhancing wall, suggestive of phlegmonous change. No associated soft tissue gas. 4.  There are no appreciable deep fascial fluid collections. Please note that necrotizing fasciitis is a clinical diagnosis and cannot be excluded by imaging alone. These results were called by telephone at the time of interpretation on 01/20/2019 at 5:55 pm to provider [facility] , who verbally acknowledged these results. Electronically Signed   By: Duanne Guess D.O.   On: 01/20/2019 17:58   DG Femur Min 2 Views Right  Result Date: 01/20/2019 CLINICAL DATA:  Right femoral pain. EXAM: RIGHT FEMUR 2 VIEWS COMPARISON:  None. FINDINGS: There is no evidence of fracture or other focal bone lesions. No evidence of soft tissue emphysema. Diffuse subcutaneous soft  tissue swelling noted. IMPRESSION: No acute fracture or dislocation identified about the right femur. Electronically Signed   By: Ted Mcalpine M.D.   On: 01/20/2019 16:01    Positive ROS: All other systems have been reviewed and were otherwise negative with the exception of those mentioned in the HPI and as above.  Objective: Labs cbc Recent Labs    01/20/19 1530  WBC 24.7*  HGB 17.7*  HCT 53.1*  PLT 324    Labs inflam Recent Labs    01/20/19 1526  CRP 35.7*    Labs coag Recent Labs    01/20/19 1530  INR 1.1    Recent Labs    01/20/19 1530  NA 134*  K 3.8  CL 94*  CO2 26  GLUCOSE 105*  BUN 13  CREATININE 1.39*  CALCIUM 8.9    Physical Exam: Vitals:   01/20/19 1642 01/20/19 1900  BP: (!) 122/54 (!) 118/45  Pulse: (!) 110 (!) 116  Resp: 18 20  Temp:    SpO2: 98% 98%   General: Alert.  Uncomfortable.  Calm and conversant. Mental status: Alert and Oriented x3 Neurologic: Speech Clear and organized, no gross focal findings or movement disorder appreciated. Respiratory: No cyanosis, no use of accessory musculature Cardiovascular: No pedal edema GI: Abdomen is soft and non-tender, non-distended. Skin: Warm and dry. Right peroneal region with induration, swelling, and tenderness. Extremities: Warm Psychiatric: Patient is competent for consent with normal mood and affect  MUSCULOSKELETAL:  RLE: Significant swelling and erythema distal right thigh.  Difficulty with range of motion of the knee and pain with active and passive motion limited to 10 to 40 degrees.  Significant tenderness to palpation laterally distal thigh and knee.  There is an approximately 1 cm longitudinal incision from previous I&D.  Packing was removed and there was purulence and hematoma underlying this.  No joint fluid or obvious connection to the joint appreciated.  Foot is warm and well-perfused.  He is neurovascularly intact distally. Other extremities are atraumatic with painless  ROM and NVI.  Assessment / Plan: Active Problems:   Abscess of right leg   Right lateral knee abscess, MRSA+, distal thigh myosytis / aggressive soft tissue infection  Keep NPO  Plan for Both Orthopedic and General Surgery in OR tomorrow first thing in the A.M., but will order lactic acid to trend.  Aspiration of right knee joint sent for Cx before start of abx.  Continue antibiotic therapies  General surgery to admit.  Evaluation of groin abscess pending.  The risks benefits and alternatives of the orthopedic procedure were discussed with the patient including but not limited to infection, bleeding, nerve injury, the need for revision surgery, blood clots, cardiopulmonary complications, morbidity, mortality, among others.  The patient verbalizes understanding and wishes to proceed.    Preprocedure diagnosis: Right knee possible sepsis in  the joint Post procedure diagnosis: Right knee lateral infection, the joint fluid appeared relatively normal Procedure: Right knee aspiration Procedure details: After informed verbal consent was obtained the right superomedial portal was prepped with Betadine and an 18-gauge needle was used to aspirate joint fluid.  I got about 2 mL of normal-appearing joint fluid that was bloody tinged.  A Band-Aid was placed.  He tolerated this well.  We will await the final results of this before making a definitive decision on whether or not he needs irrigation and debridement of the joint, he definitely needs irrigation and debridement of the lateral abscess.  Lucretia Kern [place] III PA-C 01/20/2019 7:34 PM  Patient seen and examined, agree with above, I participated in the care, performed the aspiration, and we will plan for surgical intervention tomorrow.  The risks benefits and alternatives been discussed at length, plan for right leg I&D.  Teryl Lucy, MD.

## 2019-01-20 NOTE — Progress Notes (Signed)
Notified bedside nurse of need to draw repeat lactic acid @ 2000. 

## 2019-01-20 NOTE — ED Notes (Signed)
Patient transported to X-ray 

## 2019-01-20 NOTE — ED Notes (Signed)
Bedside Report given to charge nurse Tim on 4E

## 2019-01-20 NOTE — ED Provider Notes (Signed)
This patient is an ill-appearing 24 year old male presenting tachycardic with significant swelling pain and tenderness of his right lower extremity.  The patient was initially seen a couple of days ago at the urgent care where he had a small abscess that was drained on his right knee seems to be on the lateral aspect of the kneecap.  There was purulent material that was drained and a culture was sent which grew MRSA.  Since that time the patient has had rapidly progressive and worsening redness and swelling of his leg, he was having fevers although that he states that the fevers have improved since incision and drainage but are still present up to 100 degrees.  He is taking the antibiotic he was prescribed but because of increasing pain and swelling, he came to the emergency department for evaluation.  On my exam the patient is tachycardic, he is very uncomfortable appearing, his right thigh is approximately 20% larger than the left thigh in circumference, it is tight, red, indurated, I do not feel any crepitance.  The patient has significant pain with even mild touching of the leg raising suspicion for necrotizing fasciitis.  The patient is very ill-appearing, code sepsis activated, will likely need advanced imaging if plain film shows no free air however I suspect deep tissue myositis or necrotizing fasciitis  Covid test ordered although the patient is asymptomatic as he may need surgery.  I agree with documentation of the services of Critical care provided, please see associated physician assistant [place] note.  Lactic acid is increasing despite IVF and antibiotics - CT concerning for deeper infection - seen by Dr. Dion Saucier with Ortho and soon Gen Surgery - posted for OR per Dr. Dion Saucier.    Medical screening examination/treatment/procedure(s) were conducted as a shared visit with non-physician practitioner(s) and myself.  I personally evaluated the patient during the encounter.  Clinical Impression:    Final diagnoses:  Cellulitis and abscess of leg  Sepsis with acute renal failure without septic shock, due to unspecified organism, unspecified acute renal failure type [facility])           Eber Hong, MD 01/21/19 1446

## 2019-01-20 NOTE — ED Notes (Signed)
Pt has redness from right thigh to knee. Pt's right leg has 3+ edema, 2+ tibial pulse, pt able to wiggle foot, sensation intact.

## 2019-01-20 NOTE — ED Notes (Signed)
Patient transported to CT 

## 2019-01-20 NOTE — ED Provider Notes (Addendum)
[facility]   CSN: [id number] Arrival date & time: 01/20/19  1358     History Chief Complaint  Patient presents with   knee infection    Brian Richardson is a 24 y.o. male.  Patient presents to the emergency department today with worsening right lower extremity infection.  Patient does not have any history of diabetes, smoking, immunocompromise or other chronic medical conditions.  He states that about 7 days ago he developed some pain and swelling overlying the front of his right knee.  This was mild at first.  No injuries reported.  He went to an urgent care on 1/12 and was started on Augmentin.  He followed up on 1/14 where he had an abscess drained and packed.  He was started on Septra.  A culture was taken which grew out MRSA.  Patient has continued to take Septra however symptoms have worsened.  Erythema and pain of spread up into the left thigh region.  Patient has had fevers during the course of his illness however no recent fevers.  No chest pain or shortness of breath.  He endorses some swelling of the knee.  He is able to bend his knee but very slowly due to pain. The onset of this condition was acute. The course is worsening.  Aggravating factors: movement.  Alleviating factors: none.          History reviewed. No pertinent past medical history.  Patient Active Problem List   Diagnosis Date Noted   BRONCHITIS 02/20/2008   ASTHMA 02/20/2008    History reviewed. No pertinent surgical history.     Family History  Problem Relation Age of Onset   Healthy Mother    Healthy Father     Social History   Tobacco Use   Smoking status: Never Smoker   Smokeless tobacco: Never Used  Substance Use Topics   Alcohol use: Yes   Drug use: Never    Home Medications Prior to Admission medications   Medication Sig Start Date End Date Taking? Authorizing Provider  albuterol (PROVENTIL HFA;VENTOLIN HFA) 108 (90 BASE)  MCG/ACT inhaler Inhale 1-2 puffs into the lungs every 6 (six) hours as needed for wheezing or shortness of breath. 01/17/13  Yes Presson, Jess BartersJennifer Lee H, PA  fluticasone-salmeterol (ADVAIR HFA) 115-21 MCG/ACT inhaler Inhale 2 puffs into the lungs 2 (two) times daily. 01/17/13  Yes Presson, Jess BartersJennifer Lee H, PA  HYDROcodone-acetaminophen (NORCO/VICODIN) 5-325 MG tablet Take 1 tablet by mouth every 6 (six) hours as needed for up to 5 days for moderate pain. 01/18/19 01/23/19 Yes Lamptey, Britta MccreedyPhilip O, MD  ibuprofen (ADVIL) 800 MG tablet Take 1 tablet (800 mg total) by mouth every 8 (eight) hours as needed for fever or moderate pain. 01/18/19  Yes Lamptey, Britta MccreedyPhilip O, MD  sulfamethoxazole-trimethoprim (BACTRIM DS) 800-160 MG tablet Take 1 tablet by mouth 2 (two) times daily for 7 days. 01/18/19 01/25/19 Yes Lamptey, Britta MccreedyPhilip O, MD  amoxicillin-clavulanate (AUGMENTIN) 875-125 MG tablet Take 1 tablet by mouth 2 (two) times daily. 01/16/19   [provider]    Allergies    Patient has no known allergies.  Review of Systems   Review of Systems  Constitutional: Positive for chills, fatigue and fever.  HENT: Negative for rhinorrhea and sore throat.   Eyes: Negative for redness.  Respiratory: Negative for cough.   Cardiovascular: Negative for chest pain.  Gastrointestinal: Negative for abdominal pain, diarrhea, nausea and vomiting.  Genitourinary: Negative for dysuria.  Musculoskeletal: Positive for arthralgias and myalgias.  Skin: Positive for color change. Negative for rash.  Neurological: Negative for headaches.    Physical Exam Updated Vital Signs BP 122/72 (BP Location: Right Arm)    Pulse (!) 127    Temp 97.8 F (36.6 C) (Oral)    Resp 20    SpO2 98%   Physical Exam Vitals and nursing note reviewed.  Constitutional:      Appearance: He is well-developed.  HENT:     Head: Normocephalic and atraumatic.  Eyes:     General:        Right eye: No discharge.        Left eye: No discharge.      Conjunctiva/sclera: Conjunctivae normal.  Cardiovascular:     Rate and Rhythm: Regular rhythm. Tachycardia present.     Heart sounds: Normal heart sounds.  Pulmonary:     Effort: Pulmonary effort is normal.     Breath sounds: Normal breath sounds.  Abdominal:     Palpations: Abdomen is soft.     Tenderness: There is no abdominal tenderness.  Musculoskeletal:     Cervical back: Normal range of motion and neck supple.     Right upper leg: Swelling and tenderness present.     Right knee: Swelling and effusion (Mild to moderate) present.       Legs:  Skin:    General: Skin is warm and dry.  Neurological:     Mental Status: He is alert.     ED Results / Procedures / Treatments   Labs (all labs ordered are listed, but only abnormal results are displayed) Labs Reviewed  LACTIC ACID, PLASMA - Abnormal; Notable for the following components:      Result Value   Lactic Acid, Venous 2.2 (*)    All other components within normal limits  LACTIC ACID, PLASMA - Abnormal; Notable for the following components:   Lactic Acid, Venous 2.9 (*)    All other components within normal limits  COMPREHENSIVE METABOLIC PANEL - Abnormal; Notable for the following components:   Sodium 134 (*)    Chloride 94 (*)    Glucose, Bld 105 (*)    Creatinine, Ser 1.39 (*)    Albumin 2.9 (*)    All other components within normal limits  CBC WITH DIFFERENTIAL/PLATELET - Abnormal; Notable for the following components:   WBC 24.7 (*)    RBC 5.86 (*)    Hemoglobin 17.7 (*)    HCT 53.1 (*)    Neutro Abs 21.5 (*)    Monocytes Absolute 1.8 (*)    Basophils Absolute 0.2 (*)    Abs Immature Granulocytes 0.36 (*)    All other components within normal limits  C-REACTIVE PROTEIN - Abnormal; Notable for the following components:   CRP 35.7 (*)    All other components within normal limits  SEDIMENTATION RATE - Abnormal; Notable for the following components:   Sed Rate 80 (*)    All other components within normal  limits  RESPIRATORY PANEL BY RT PCR (FLU A&B, COVID)  CULTURE, BLOOD (ROUTINE X 2)  CULTURE, BLOOD (ROUTINE X 2)  GRAM STAIN  APTT  PROTIME-INR  CK  HEMOGLOBIN A1C  SYNOVIAL CELL COUNT + DIFF, W/ CRYSTALS    EKG EKG Interpretation  Date/Time:  Saturday January 20 2019 15:32:01 EST Ventricular Rate:  116 PR Interval:  124 QRS Duration: 88 QT Interval:  312 QTC Calculation: 433 R Axis:   105 Text Interpretation: Sinus tachycardia Rightward  axis T wave abnormality, consider inferior ischemia Abnormal ECG No old tracing to compare Confirmed by Eber Hong ([id number]) on 01/20/2019 4:27:40 PM   Radiology CT FEMUR RIGHT W CONTRAST  Result Date: 01/20/2019 CLINICAL DATA:  Right thigh swelling EXAM: CT OF THE LOWER RIGHT EXTREMITY WITH CONTRAST TECHNIQUE: Multidetector CT imaging of the lower right extremity was performed according to the standard protocol following intravenous contrast administration. COMPARISON:  X-ray 01/20/2019 CONTRAST:  OMNIPAQUE IOHEXOL 300 MG/ML  SOLN FINDINGS: Bones/Joint/Cartilage No acute fracture or dislocation. No periostitis or cortical destruction. No lytic or sclerotic bony lesion. Joint spaces of the right hip, right knee, SI joints, and pubic symphysis are maintained without evidence of arthropathy. No appreciable hip joint effusion. No knee joint effusion. Ligaments Suboptimally assessed by CT. Muscles and Tendons Normal muscle bulk without atrophy or fatty infiltration. No intramuscular fluid collection or evidence of intramuscular abscess. Tendinous structures about the lower extremity appear intact within the limitations of CT. Soft tissues There is extensive induration and ill-defined fluid density within the soft tissues of the right perineum and inferior gluteal region measuring approximately 12 x 4.5 x 9 cm (series 5, image 123; series 9, image 99). There is no well-defined or rim enhancing wall. No associated soft tissue gas. There is overlying skin  thickening. Inflammatory changes within this region do not appear to involve scrotal soft tissues. Overlying the anterolateral aspect of the distal femur just proximal to the knee, there is extensive skin thickening with underlying induration within the subcutaneous soft tissues. Extensive fluid accumulates within the subcutaneous tissues of the mid to distal femur, which is most pronounced anterior laterally. There is 1 tiny focus of low attenuation seen within the soft tissues adjacent to the lateral femoral condyle (series 5, image 298) which could reflect a small focus of noninflamed normal fat versus a tiny locule of gas. There are somewhat elongated areas of increased attenuation within this region at the level of the distal femoral metaphysis (series 5, image 285; series 9, image 53). There is no appreciable deep fascial fluid collections. IMPRESSION: 1. Extensive subcutaneous inflammatory changes with edema and fluid accumulating within the subcutaneous tissues of the mid to distal right femur, most pronounced anterolaterally. No drainable or rim enhancing fluid component. There is one tiny focus of low attenuation seen within the soft tissues adjacent to the lateral femoral condyle, which could reflect a small focus of non-inflamed normal fat versus a tiny locule of gas. Overall, findings are suggestive of an aggressive soft tissue infection. 2. There are somewhat elongated areas of increased attenuation within this region laterally at the level of the distal femoral metaphysis possibly representing superficial thrombophlebitis. 3. Extensive induration and ill-defined fluid density within the soft tissues of the right perineum and inferior gluteal region measuring approximately 12 x 4.5 x 9 cm without well-defined or rim enhancing wall, suggestive of phlegmonous change. No associated soft tissue gas. 4. There are no appreciable deep fascial fluid collections. Please note that necrotizing fasciitis is a  clinical diagnosis and cannot be excluded by imaging alone. These results were called by telephone at the time of interpretation on 01/20/2019 at 5:55 pm to provider [facility] , who verbally acknowledged these results. Electronically Signed   By: Duanne Guess D.O.   On: 01/20/2019 17:58   DG Femur Min 2 Views Right  Result Date: 01/20/2019 CLINICAL DATA:  Right femoral pain. EXAM: RIGHT FEMUR 2 VIEWS COMPARISON:  None. FINDINGS: There is no evidence of fracture or  other focal bone lesions. No evidence of soft tissue emphysema. Diffuse subcutaneous soft tissue swelling noted. IMPRESSION: No acute fracture or dislocation identified about the right femur. Electronically Signed   By: Fidela Salisbury M.D.   On: 01/20/2019 16:01    Procedures .Critical Care Performed by: Carlisle Cater, PA-C Authorized by: Carlisle Cater, PA-C   Critical care provider statement:    Critical care time (minutes):  45   Critical care time was exclusive of:  Separately billable procedures and treating other patients   Critical care was necessary to treat or prevent imminent or life-threatening deterioration of the following conditions:  Renal failure and sepsis   Critical care was time spent personally by me on the following activities:  Ordering and performing treatments and interventions, development of treatment plan with patient or surrogate, ordering and review of laboratory studies, ordering and review of radiographic studies, discussions with consultants, pulse oximetry, evaluation of patient's response to treatment, re-evaluation of patient's condition, examination of patient, review of old charts and obtaining history from patient or surrogate   (including critical care time)  Medications Ordered in ED Medications  sodium chloride 0.9 % bolus 1,000 mL (has no administration in time range)  HYDROmorphone (DILAUDID) injection 0.5 mg (has no administration in time range)  ondansetron (ZOFRAN) injection  4 mg (has no administration in time range)  vancomycin (VANCOREADY) IVPB 1250 mg/250 mL (has no administration in time range)  clindamycin (CLEOCIN) IVPB 600 mg (has no administration in time range)  piperacillin-tazobactam (ZOSYN) IVPB 3.375 g (has no administration in time range)    Followed by  piperacillin-tazobactam (ZOSYN) IVPB 3.375 g (has no administration in time range)    ED Course  I have reviewed the triage vital signs and the nursing notes.  Pertinent labs & imaging results that were available during my care of the patient were reviewed by me and considered in my medical decision making (see chart for details).  Patient seen and examined. Dressing taken down with assistance and reapplied.  Packing came out with removal of dressing.  Reviewed notes from urgent care 2 days ago.  Fortunately culture was taken 2 days ago and has returned with MRSA.  Vancomycin ordered.  Patient with extensive cellulitis extending up the leg.  Concern for sepsis given tachycardia.  He will require additional imaging of the leg.  X-ray ordered to look for soft tissue gas while CT ordered to evaluate extent of infection and to look for any abscess.  Septic bursitis, cellulitis, soft tissue abscess on differential.  Considering necrotizing soft tissue infection given extent of pain and tachycardia, however patient has had symptoms for nearly a week now making this less likely.  No crepitus on palpation.    Vital signs reviewed and are as follows: BP 122/72 (BP Location: Right Arm)    Pulse (!) 127    Temp 97.8 F (36.6 C) (Oral)    Resp 20    Ht 6' (1.829 m)    Wt 106.6 kg    SpO2 98%    BMI 31.87 kg/m   Patient discussed with and seen by Dr. Sabra Heck.  Sepsis work-up initiated.  Initial antibiotics will be broad-spectrum to cover MRSA but also cover for necrotizing soft tissue infection until this is more definitively ruled out.  Covid testing ordered.  Patient aware of need for admission.  Will likely need  specialty consultation.   4:04 PM Soft tissue x-ray with diffuse tissue swelling. Discussed appropriate CT order with tech -- changed  to femur.   6:38 PM spoke earlier with radiologist regarding CT results.  I spoke with Dr. Dion Saucier of orthopedic surgery who will come see the patient.  He is actually at bedside at the current time.  I spoke with on-call general surgery who will consult on patient as well.   7:24 PM Admit by ortho or gen surg. Plan for OR in the AM.       MDM Rules/Calculators/A&P                      Admit.   Final Clinical Impression(s) / ED Diagnoses Final diagnoses:  Cellulitis and abscess of leg  Sepsis with acute renal failure without septic shock, due to unspecified organism, unspecified acute renal failure type [facility])    Rx / DC Orders ED Discharge Orders    None        Renne Crigler, PA-C 01/20/19 1926    Eber Hong, MD 01/21/19 1446

## 2019-01-21 ENCOUNTER — Encounter (HOSPITAL_COMMUNITY): Payer: Self-pay

## 2019-01-21 ENCOUNTER — Encounter (HOSPITAL_COMMUNITY): Admission: EM | Disposition: A | Discharge status: Home / Self Care | Payer: Self-pay | Source: Home / Self Care

## 2019-01-21 ENCOUNTER — Inpatient Hospital Stay (HOSPITAL_COMMUNITY): Payer: Commercial Managed Care - PPO | Admitting: Certified Registered Nurse Anesthetist

## 2019-01-21 HISTORY — PX: I & D EXTREMITY: SHX5045

## 2019-01-21 HISTORY — PX: INCISION AND DRAINAGE ABSCESS: SHX5864

## 2019-01-21 HISTORY — PX: APPLICATION OF WOUND VAC: SHX5189

## 2019-01-21 LAB — GRAM STAIN

## 2019-01-21 LAB — MRSA PCR SCREENING: MRSA by PCR: POSITIVE — AB

## 2019-01-21 SURGERY — IRRIGATION AND DEBRIDEMENT EXTREMITY
Anesthesia: General | Site: Leg Lower | Laterality: Right

## 2019-01-21 MED ORDER — LACTATED RINGERS IV SOLN: INTRAVENOUS | Status: DC

## 2019-01-21 MED ORDER — SURGILUBE EX GEL
CUTANEOUS | Status: DC | PRN
  Administered 2019-01-21: 1 via TOPICAL

## 2019-01-21 MED ORDER — ONDANSETRON HCL 4 MG/2ML IJ SOLN
INTRAMUSCULAR | Status: AC
  Filled 2019-01-21: qty 2

## 2019-01-21 MED ORDER — PROPOFOL 10 MG/ML IV BOLUS
INTRAVENOUS | Status: AC
  Filled 2019-01-21: qty 40

## 2019-01-21 MED ORDER — DIPHENHYDRAMINE HCL 50 MG/ML IJ SOLN
INTRAMUSCULAR | Status: DC | PRN
  Administered 2019-01-21: 12.5 mg via INTRAVENOUS

## 2019-01-21 MED ORDER — MIDAZOLAM HCL 2 MG/2ML IJ SOLN
INTRAMUSCULAR | Status: AC
  Filled 2019-01-21: qty 2

## 2019-01-21 MED ORDER — FENTANYL CITRATE (PF) 250 MCG/5ML IJ SOLN
INTRAMUSCULAR | Status: AC
  Filled 2019-01-21: qty 5

## 2019-01-21 MED ORDER — PROPOFOL 10 MG/ML IV BOLUS
INTRAVENOUS | Status: DC | PRN
  Administered 2019-01-21: 200 mg via INTRAVENOUS

## 2019-01-21 MED ORDER — VANCOMYCIN HCL 1000 MG IV SOLR
INTRAVENOUS | Status: AC
  Filled 2019-01-21: qty 1000

## 2019-01-21 MED ORDER — HYDROMORPHONE HCL 1 MG/ML IJ SOLN
0.2500 mg | INTRAMUSCULAR | Status: DC | PRN
  Administered 2019-01-21 (×2): 0.5 mg via INTRAVENOUS

## 2019-01-21 MED ORDER — MIDAZOLAM HCL 5 MG/5ML IJ SOLN
INTRAMUSCULAR | Status: DC | PRN
  Administered 2019-01-21: 2 mg via INTRAVENOUS

## 2019-01-21 MED ORDER — SODIUM CHLORIDE 0.9 % IR SOLN
Status: DC | PRN
  Administered 2019-01-21: 3

## 2019-01-21 MED ORDER — PHENYLEPHRINE 40 MCG/ML (10ML) SYRINGE FOR IV PUSH (FOR BLOOD PRESSURE SUPPORT)
PREFILLED_SYRINGE | INTRAVENOUS | Status: AC
  Filled 2019-01-21: qty 10

## 2019-01-21 MED ORDER — 0.9 % SODIUM CHLORIDE (POUR BTL) OPTIME
TOPICAL | Status: DC | PRN
  Administered 2019-01-21: 1000 mL

## 2019-01-21 MED ORDER — HYDROMORPHONE HCL 1 MG/ML IJ SOLN
INTRAMUSCULAR | Status: AC
  Filled 2019-01-21: qty 1

## 2019-01-21 MED ORDER — ROCURONIUM BROMIDE 10 MG/ML (PF) SYRINGE
PREFILLED_SYRINGE | INTRAVENOUS | Status: AC
  Filled 2019-01-21: qty 10

## 2019-01-21 MED ORDER — DEXAMETHASONE SODIUM PHOSPHATE 10 MG/ML IJ SOLN
INTRAMUSCULAR | Status: AC
  Filled 2019-01-21: qty 1

## 2019-01-21 MED ORDER — EPHEDRINE 5 MG/ML INJ
INTRAVENOUS | Status: AC
  Filled 2019-01-21: qty 10

## 2019-01-21 MED ORDER — ALBUTEROL SULFATE HFA 108 (90 BASE) MCG/ACT IN AERS
INHALATION_SPRAY | RESPIRATORY_TRACT | Status: DC | PRN
  Administered 2019-01-21: 2 via RESPIRATORY_TRACT

## 2019-01-21 MED ORDER — SUCCINYLCHOLINE CHLORIDE 200 MG/10ML IV SOSY
PREFILLED_SYRINGE | INTRAVENOUS | Status: AC
  Filled 2019-01-21: qty 10

## 2019-01-21 MED ORDER — LIDOCAINE 2% (20 MG/ML) 5 ML SYRINGE
INTRAMUSCULAR | Status: DC | PRN
  Administered 2019-01-21: 60 mg via INTRAVENOUS

## 2019-01-21 MED ORDER — FENTANYL CITRATE (PF) 100 MCG/2ML IJ SOLN
INTRAMUSCULAR | Status: DC | PRN
  Administered 2019-01-21 (×4): 50 ug via INTRAVENOUS
  Administered 2019-01-21: 100 ug via INTRAVENOUS

## 2019-01-21 MED ORDER — VANCOMYCIN HCL 1000 MG IV SOLR
INTRAVENOUS | Status: DC | PRN
  Administered 2019-01-21: 1000 mg

## 2019-01-21 MED ORDER — LIDOCAINE 2% (20 MG/ML) 5 ML SYRINGE
INTRAMUSCULAR | Status: AC
  Filled 2019-01-21: qty 5

## 2019-01-21 SURGICAL SUPPLY — 68 items
BNDG COHESIVE 4X5 TAN STRL (GAUZE/BANDAGES/DRESSINGS) ×4 IMPLANT
BNDG ELASTIC 4X5.8 VLCR STR LF (GAUZE/BANDAGES/DRESSINGS) ×4 IMPLANT
BNDG ELASTIC 6X15 VLCR STRL LF (GAUZE/BANDAGES/DRESSINGS) ×4 IMPLANT
BNDG ELASTIC 6X5.8 VLCR STR LF (GAUZE/BANDAGES/DRESSINGS) ×4 IMPLANT
BNDG GAUZE ELAST 4 BULKY (GAUZE/BANDAGES/DRESSINGS) ×4 IMPLANT
BOOTCOVER CLEANROOM LRG (PROTECTIVE WEAR) ×8 IMPLANT
BRIEF STRETCH FOR OB PAD LRG (UNDERPADS AND DIAPERS) ×4 IMPLANT
CANISTER SUCT 3000ML PPV (MISCELLANEOUS) ×4 IMPLANT
CONT SPEC 4OZ CLIKSEAL STRL BL (MISCELLANEOUS) ×4 IMPLANT
COVER SURGICAL LIGHT HANDLE (MISCELLANEOUS) ×4 IMPLANT
COVER WAND RF STERILE (DRAPES) ×4 IMPLANT
CUFF TOURN SGL QUICK 34 (TOURNIQUET CUFF)
CUFF TRNQT CYL 34X4.125X (TOURNIQUET CUFF) IMPLANT
DRAIN PENROSE 18X1/4 LTX STRL (WOUND CARE) ×4 IMPLANT
DRESSING PEEL AND PLC PRVNA 13 (GAUZE/BANDAGES/DRESSINGS) IMPLANT
DRSG PAD ABDOMINAL 8X10 ST (GAUZE/BANDAGES/DRESSINGS) ×4 IMPLANT
DRSG PEEL AND PLACE PREVENA 13 (GAUZE/BANDAGES/DRESSINGS)
DRSG VAC ATS MED SENSATRAC (GAUZE/BANDAGES/DRESSINGS) ×4 IMPLANT
DURAPREP 26ML APPLICATOR (WOUND CARE) IMPLANT
ELECT REM PT RETURN 9FT ADLT (ELECTROSURGICAL)
ELECTRODE REM PT RTRN 9FT ADLT (ELECTROSURGICAL) IMPLANT
EVACUATOR 1/8 PVC DRAIN (DRAIN) IMPLANT
GAUZE PACKING IODOFORM 1/2 (PACKING) IMPLANT
GAUZE SPONGE 4X4 12PLY STRL (GAUZE/BANDAGES/DRESSINGS) ×4 IMPLANT
GAUZE SPONGE 4X4 12PLY STRL LF (GAUZE/BANDAGES/DRESSINGS) ×4 IMPLANT
GAUZE XEROFORM 1X8 LF (GAUZE/BANDAGES/DRESSINGS) ×4 IMPLANT
GLOVE BIO SURGEON STRL SZ7.5 (GLOVE) ×4 IMPLANT
GLOVE BIOGEL PI ORTHO PRO SZ8 (GLOVE) ×2
GLOVE INDICATOR 8.0 STRL GRN (GLOVE) ×4 IMPLANT
GLOVE ORTHO TXT STRL SZ7.5 (GLOVE) ×4 IMPLANT
GLOVE PI ORTHO PRO STRL SZ8 (GLOVE) ×2 IMPLANT
GLOVE SURG ORTHO 8.0 STRL STRW (GLOVE) ×8 IMPLANT
GOWN STRL REUS W/ TWL LRG LVL3 (GOWN DISPOSABLE) ×2 IMPLANT
GOWN STRL REUS W/ TWL XL LVL3 (GOWN DISPOSABLE) ×2 IMPLANT
GOWN STRL REUS W/TWL LRG LVL3 (GOWN DISPOSABLE) ×3
GOWN STRL REUS W/TWL XL LVL3 (GOWN DISPOSABLE) ×2
HANDPIECE INTERPULSE COAX TIP (DISPOSABLE)
KIT BASIN OR (CUSTOM PROCEDURE TRAY) ×4 IMPLANT
KIT DRAINAGE VACCUM ASSIST (KITS) ×4 IMPLANT
KIT TURNOVER KIT B (KITS) ×4 IMPLANT
MANIFOLD NEPTUNE II (INSTRUMENTS) ×4 IMPLANT
NEEDLE 18GX1X1/2 (RX/OR ONLY) (NEEDLE) IMPLANT
NS IRRIG 1000ML POUR BTL (IV SOLUTION) ×4 IMPLANT
PACK LITHOTOMY IV (CUSTOM PROCEDURE TRAY) ×4 IMPLANT
PACK ORTHO EXTREMITY (CUSTOM PROCEDURE TRAY) ×4 IMPLANT
PAD ARMBOARD 7.5X6 YLW CONV (MISCELLANEOUS) ×8 IMPLANT
PAD CAST 4YDX4 CTTN HI CHSV (CAST SUPPLIES) ×2 IMPLANT
PADDING CAST COTTON 4X4 STRL (CAST SUPPLIES) ×3
PADDING CAST COTTON 6X4 STRL (CAST SUPPLIES) ×4 IMPLANT
PENCIL SMOKE EVACUATOR (MISCELLANEOUS) ×8 IMPLANT
SET HNDPC FAN SPRY TIP SCT (DISPOSABLE) IMPLANT
SPONGE LAP 18X18 RF (DISPOSABLE) ×8 IMPLANT
STOCKINETTE IMPERVIOUS 9X36 MD (GAUZE/BANDAGES/DRESSINGS) ×4 IMPLANT
SUT ETHILON 2 0 FS 18 (SUTURE) ×8 IMPLANT
SUT ETHILON 3 0 PS 1 (SUTURE) IMPLANT
SUT SILK 2 0 PERMA HAND 18 BK (SUTURE) ×8 IMPLANT
SWAB COLLECTION DEVICE MRSA (MISCELLANEOUS) ×8 IMPLANT
SWAB CULTURE ESWAB REG 1ML (MISCELLANEOUS) IMPLANT
SWAB CULTURE LIQUID MINI MALE (MISCELLANEOUS) ×8 IMPLANT
SYR BULB IRRIGATION 50ML (SYRINGE) ×4 IMPLANT
SYR CONTROL 10ML LL (SYRINGE) IMPLANT
TOWEL GREEN STERILE (TOWEL DISPOSABLE) ×4 IMPLANT
TOWEL GREEN STERILE FF (TOWEL DISPOSABLE) ×4 IMPLANT
TUBE CONNECTING 12'X1/4 (SUCTIONS) ×1
TUBE CONNECTING 12X1/4 (SUCTIONS) ×3 IMPLANT
UNDERPAD 30X30 (UNDERPADS AND DIAPERS) ×4 IMPLANT
WATER STERILE IRR 1000ML POUR (IV SOLUTION) ×4 IMPLANT
YANKAUER SUCT BULB TIP NO VENT (SUCTIONS) ×4 IMPLANT

## 2019-01-21 NOTE — Progress Notes (Signed)
PT received from PACU. Pt given chg bath. ccmd notified. Vitals stable. Call bell within reach. Will continue to monitor.  Lacy Duverney, RN

## 2019-01-21 NOTE — Anesthesia Procedure Notes (Signed)
Procedure Name: Intubation Date/Time: 01/21/2019 7:59 AM Performed by: Demetrio Lapping, CRNA Pre-anesthesia Checklist: Patient identified, Emergency Drugs available, Suction available and Patient being monitored Patient Re-evaluated:Patient Re-evaluated prior to induction Oxygen Delivery Method: Circle System Utilized Preoxygenation: Pre-oxygenation with 100% oxygen Induction Type: IV induction Ventilation: Mask ventilation without difficulty LMA: LMA inserted LMA Size: 5.0 Number of attempts: 1 Airway Equipment and Method: Bite block Placement Confirmation: ETT inserted through vocal cords under direct vision,  positive ETCO2 and breath sounds checked- equal and bilateral Tube secured with: Tape Dental Injury: Teeth and Oropharynx as per pre-operative assessment

## 2019-01-21 NOTE — Anesthesia Preprocedure Evaluation (Addendum)
Anesthesia Evaluation  Patient identified by MRN, date of birth, ID band Patient awake    Reviewed: Allergy & Precautions, H&P , NPO status , Patient's Chart, lab work & pertinent test results  History of Anesthesia Complications (+) PONV  Airway Mallampati: II  TM Distance: >3 FB Neck ROM: Full    Dental no notable dental hx. (+) Teeth Intact, Dental Advisory Given   Pulmonary neg pulmonary ROS,    Pulmonary exam normal breath sounds clear to auscultation       Cardiovascular negative cardio ROS   Rhythm:Regular Rate:Normal     Neuro/Psych negative neurological ROS  negative psych ROS   GI/Hepatic negative GI ROS, Neg liver ROS,   Endo/Other  negative endocrine ROS  Renal/GU negative Renal ROS  negative genitourinary   Musculoskeletal   Abdominal   Peds  Hematology negative hematology ROS (+)   Anesthesia Other Findings   Reproductive/Obstetrics negative OB ROS                            Anesthesia Physical Anesthesia Plan  ASA: II  Anesthesia Plan: General   Post-op Pain Management:    Induction: Intravenous  PONV Risk Score and Plan: 3 and Ondansetron, Midazolam and Diphenhydramine  Airway Management Planned: Oral ETT and LMA  Additional Equipment:   Intra-op Plan:   Post-operative Plan: Extubation in OR  Informed Consent: I have reviewed the patients History and Physical, chart, labs and discussed the procedure including the risks, benefits and alternatives for the proposed anesthesia with the patient or authorized representative who has indicated his/her understanding and acceptance.     Dental advisory given  Plan Discussed with: CRNA  Anesthesia Plan Comments:         Anesthesia Quick Evaluation

## 2019-01-21 NOTE — Op Note (Signed)
01/20/2019 - 01/21/2019  8:56 AM  PATIENT:  Brian Richardson    PRE-OPERATIVE DIAGNOSIS:    Right thigh abscess   POST-OPERATIVE DIAGNOSIS: Right thigh abscess with necrotizing fasciitis a sending up just above the tissue plane of the iliotibial band  PROCEDURE: 1.  Excisional debridement, right thigh, extending down to the lateral aspect of the knee 2.  Application of 14 cm wound VAC by 3 cm  SURGEON:  Eulas Post, MD  PHYSICIAN ASSISTANT: HC Martensen, PA-C, present and scrubbed throughout the case, critical for completion in a timely fashion, and for retraction, instrumentation, and closure.  ANESTHESIA:   General  PREOPERATIVE INDICATIONS:  Brian Richardson is a  24 y.o. male who presented with worsening right thigh cellulitis and drainage after an emergency room irrigation and debridement.  He works as an Personnel officer, does not have risk factors, but has presented with increasing pain and swelling with fevers over the last week.  The risks benefits and alternatives were discussed with the patient preoperatively including but not limited to the risks of infection, bleeding, nerve injury, cardiopulmonary complications, the need for revision surgery, among others, and the patient was willing to proceed.  We also discussed the potential need for repeat I&D, placement of wound VAC, among others.  ESTIMATED BLOOD LOSS: 100 mL  OPERATIVE IMPLANTS: Medium sized wound VAC  OPERATIVE FINDINGS: Substantial purulence with necrosis of the adipose tissue and the subcutaneous tissue extending from the level of the joint proximally to about the mid thigh.  The joint did not appear involved, I could easily range the joint and there was no fluid coming through the fascia or capsule.  There was a very small bulla that was just at the anterior aspect of the distal portion of the wound that I suspect was the portal of entry for bacteria.  He also had a small bulla over the medial aspect, but it did not  communicate to the area of the necrosis and purulence.  OPERATIVE PROCEDURE: The patient is brought to the operating room and placed in the supine position.  General anesthesia was administered.  He was on a standardized dosing regimen of vancomycin, Zosyn, and clindamycin I believe.  The right lower extremity was prepped and draped in usual sterile fashion, timeout performed, and incision was extended proximally and distally from his previous I&D wound, which was actually a transverse incision that was only about 1 cm at biggest.  I expressed significant purulence, took Gram stain culture and sensitivity and sent the tissue.  I excised the subcutaneous tissue with a scalpel as well as a lap sponge, used a Cobb curette, debrided down to the level of the fascia, but not deeper.  I went proximally and distally, and although I did not extend the incision all the way the entire length of the thigh, I did evacuate purulent hematoma proximally.  My incision was 14 cm.  I did place 2 retention sutures to minimize skin tension that laid over the sponge.  I irrigated a total of 9 L through the wound, and then applied a wound VAC.  Excellent seal was achieved.  Sterile gauze was applied, and he was then repositioned for the irrigation and debridement of his perineal abscess with general surgery.  He will likely return to the operating room on Tuesday for delayed primary closure.

## 2019-01-21 NOTE — Transfer of Care (Signed)
Immediate Anesthesia Transfer of Care Note  Patient: Brian Richardson  Procedure(s) Performed: IRRIGATION AND DEBRIDEMENT EXTREMITY (Right Leg Lower) Incision and Drainage of Gluteal Abscess with debridement of Necrotic Fat (Right Buttocks)  Patient Location: PACU  Anesthesia Type:General  Level of Consciousness: drowsy and patient cooperative  Airway & Oxygen Therapy: Patient Spontanous Breathing and Patient connected to face mask oxygen  Post-op Assessment: Report given to RN and Post -op Vital signs reviewed and stable  Post vital signs: Reviewed and stable  Last Vitals:  Vitals Value Taken Time  BP 139/74 01/21/19 0958  Temp 36.7 C 01/21/19 0958  Pulse 120 01/21/19 0959  Resp 18 01/21/19 0959  SpO2 99 % 01/21/19 0959  Vitals shown include unvalidated device data.  Last Pain:  Vitals:   01/21/19 0705  TempSrc:   PainSc: 0-No pain      Patients Stated Pain Goal: 3 (01/21/19 0705)  Complications: No apparent anesthesia complications

## 2019-01-21 NOTE — Progress Notes (Signed)
Subjective No acute events. Feeling better today than last night. Perineum has began to spontaneously drain pus.  Objective: Vital signs in last 24 hours: Temp:  [97.8 F (36.6 C)-100.2 F (37.9 C)] 98.3 F (36.8 C) (01/17 0515) Pulse Rate:  [100-129] 108 (01/17 0515) Resp:  [16-23] 19 (01/17 0515) BP: (113-124)/(35-72) 124/56 (01/17 0515) SpO2:  [92 %-98 %] 98 % (01/17 0515) Weight:  [106.6 kg] 106.6 kg (01/16 2100)    Intake/Output from previous day: 01/16 0701 - 01/17 0700 In: 3250 [IV Piggyback:3250] Out: -  Intake/Output this shift: No intake/output data recorded.  Gen: NAD, comfortable CV: RRR Pulm: Normal work of breathing GU: Right buttock area/perineum with active purulent drainage; erythema surrounding this Ext: SCDs in place; right knee with erythema, dressing covering  Lab Results: CBC  Recent Labs    01/20/19 1530  WBC 24.7*  HGB 17.7*  HCT 53.1*  PLT 324   BMET Recent Labs    01/20/19 1530  NA 134*  K 3.8  CL 94*  CO2 26  GLUCOSE 105*  BUN 13  CREATININE 1.39*  CALCIUM 8.9   PT/INR Recent Labs    01/20/19 1530  LABPROT 14.2  INR 1.1   ABG No results for input(s): PHART, HCO3 in the last 72 hours.  Invalid input(s): PCO2, PO2  Studies/Results:  Anti-infectives: Anti-infectives (From admission, onward)   Start     Dose/Rate Route Frequency Ordered Stop   01/21/19 0600  ceFAZolin (ANCEF) IVPB 2g/100 mL premix     2 g 200 mL/hr over 30 Minutes Intravenous On call to O.R. 01/20/19 2149 01/22/19 0559   01/21/19 0000  [MAR Hold]  clindamycin (CLEOCIN) IVPB 900 mg     (MAR Hold since Sun 01/21/2019 at 0646.Hold Reason: Transfer to a Procedural area.)   900 mg 100 mL/hr over 30 Minutes Intravenous Every 8 hours 01/20/19 1951     01/20/19 2200  [MAR Hold]  piperacillin-tazobactam (ZOSYN) IVPB 3.375 g     (MAR Hold since Sun 01/21/2019 at 0646.Hold Reason: Transfer to a Procedural area.)   3.375 g 12.5 mL/hr over 240 Minutes Intravenous  Every 8 hours 01/20/19 1534     01/20/19 2200  piperacillin-tazobactam (ZOSYN) IVPB 3.375 g  Status:  Discontinued     3.375 g 12.5 mL/hr over 240 Minutes Intravenous Every 8 hours 01/20/19 1951 01/20/19 2019   01/20/19 1545  piperacillin-tazobactam (ZOSYN) IVPB 3.375 g     3.375 g 100 mL/hr over 30 Minutes Intravenous  Once 01/20/19 1534 01/20/19 1730   01/20/19 1530  [MAR Hold]  vancomycin (VANCOREADY) IVPB 1250 mg/250 mL     (MAR Hold since Sun 01/21/2019 at 0646.Hold Reason: Transfer to a Procedural area.)   1,250 mg 166.7 mL/hr over 90 Minutes Intravenous Every 8 hours 01/20/19 1526     01/20/19 1530  clindamycin (CLEOCIN) IVPB 600 mg     600 mg 100 mL/hr over 30 Minutes Intravenous  Once 01/20/19 1529 01/20/19 1730       Assessment/Plan: Patient Active Problem List   Diagnosis Date Noted  . Abscess of right leg 01/20/2019  . Soft tissue infection 01/20/2019  . BRONCHITIS 02/20/2008  . ASTHMA 02/20/2008   -Given active purulent drainage from buttock and discussions last night with ortho/Dr. Bedelia Person - we discussed proceeding with incision/drainage/possible debridement of right buttock concurrently with Dr. Dion Saucier whom is cleaning up his right knee -The planned procedure, material risks (including, but not limited to, pain, bleeding, worsening infection and need for further  debridement, scarring, need for blood transfusion, damage to surrounding structures- blood vessels/nerves/viscus/organs, worsening of pre-existing medical conditions, recurrence) benefits and alternatives to surgery were discussed at length. The patient's questions were answered to his satisfaction, he voiced understanding and elected to proceed with surgery. Additionally, we discussed typical postoperative expectations and the recovery process - we discussed potential for multiple trips to OR for washout/further debridement based on how things go -He requested I update his mother, Chriss Czar, at conclusion    LOS: 1 day   Sharon Mt. Dema Severin, M.D. [facility] Use [email or web address] to contact on call provider

## 2019-01-21 NOTE — Anesthesia Postprocedure Evaluation (Signed)
Anesthesia Post Note  Patient: Brian Richardson  Procedure(s) Performed: IRRIGATION AND DEBRIDEMENT EXTREMITY (Right Leg Lower) Incision and Drainage of Gluteal Abscess with debridement of Necrotic Fat (Right Buttocks) Application Of Wound Vac (Right Leg Lower)     Patient location during evaluation: PACU Anesthesia Type: General Level of consciousness: awake and alert Pain management: pain level controlled Vital Signs Assessment: post-procedure vital signs reviewed and stable Respiratory status: spontaneous breathing, nonlabored ventilation and respiratory function stable Cardiovascular status: blood pressure returned to baseline and stable Postop Assessment: no apparent nausea or vomiting Anesthetic complications: no    Last Vitals:  Vitals:   01/21/19 1058 01/21/19 1108  BP: (!) 143/70 (!) 145/79  Pulse: (!) 118 (!) 115  Resp: 14 16  Temp:  36.8 C  SpO2: 91% 95%    Last Pain:  Vitals:   01/21/19 1131  TempSrc:   PainSc: 7                  Jamile Sivils,W. EDMOND

## 2019-01-21 NOTE — Progress Notes (Signed)
Patient reports that his groin abscess popped open last night.   This procedure has been fully reviewed with the patient and written informed consent has been obtained.  Knee tap negative.   I&D leg and groin, combo with Dr. Cliffton Asters with general surgery.   Eulas Post, MD

## 2019-01-21 NOTE — Op Note (Signed)
01/21/2019  9:49 AM  PATIENT:  Brian Richardson  24 y.o. male  Patient Care Team: Patient, No Pcp Per as PCP - General (General Practice)  PRE-OPERATIVE DIAGNOSIS:  Right perineal/gluteal abscess  POST-OPERATIVE DIAGNOSIS:  Right gluteal abscess  PROCEDURE:  1. Incision and drainage of right gluteal abscess 2. Sharp debridement of necrotic subcutaneous fat, 4cm2  SURGEON:  Stephanie Coup. Marrisa Kimber, MD  ANESTHESIA:   general with LMA  COUNTS:  Sponge, needle and instrument counts were reported correct x2 at the conclusion of the operation.  EBL: 50 mL for this portion of the procedure  DRAINS: 2 penrose drains placed through counter-incisions  SPECIMEN: Cultures of abscess sent for Gram stain, aerobe/anaerobic cultures  COMPLICATIONS: None  FINDINGS: Spontaneously draining right gluteal abscess.  This tract anteriorly and posteriorly.  All loculations were opened.  2 separate counterincisions were made one on the anterior side of the abscess cavity and the other on the posterior side.  The entire abscess cavity covered approximately 10 to 12 cm of the right buttock.  There was necrotic fat within this cavity which was sharply debrided using scissors.  This measured 4 cm.  After placing the 2 separate Penrose drains, the wound was packed with a single moist Kerlix.  DISPOSITION: PACU in satisfactory condition  INDICATION: Brian Richardson is a very pleasant 23yoM with admitted with right knee infection/cellulitis and a skip area to the right buttock. He had initially presented to outpatient care with right knee complaints and was being treated for abscess/infection at this location. He was admitted last night by my partner after reviewing everything with Dr. Dion Saucier. Tentative plan was for OR this morning for combined case.   DESCRIPTION: The patient was identified in preop holding and taken to the OR where was placed on the operating room table. General anesthesia was induced without difficulty.  He was then prepped and draped in the usual sterile fashion for Dr. Shelba Flake portion - please refer to his notes for details.  I subsequently came to the room and he had been repositioned on the OR table into the lithotomy position using Allen stirrups.  Pressure points were then padded. A surgical timeout was performed indicating the correct patient, procedure, positioning and need for preoperative antibiotics.   The right buttock had an area of active purulent drainage.  This was further opened and cultures were obtained.  All loculations were carefully broken up bluntly.  The spontaneous opening was at the midportion of the abscess cavity.  This tracked anteriorly and posteriorly.  In each direction, this is approximately 5 cm.  For this reason, the decision was made to proceed with Penrose drain placement after all loculations had been freed.  2 separate quadrant Penrose drains were placed and secured to themselves using 2-0 silk suture.  There was approximately 4 x 4 centimeter square of necrotic fat which was removed from the wound bed.  This was done sharply using scissors.  This was subcutaneous fat.  There was no evidence of extension into the perianal or perirectal spaces.  This all appeared to be contained in the gluteal area.  The wound was copiously irrigated with saline until the effluent ran clear.  Hemostasis was then verified.  The wound was then additionally packed with a single moist Kerlix gauze.  A dressing consisting of 4 x 4 gauze, ABD pad, and mesh underwear was then placed.  He was taken out of lithotomy, awakened from anesthesia, extubated, and transferred to recovery in satisfactory condition.

## 2019-01-22 LAB — BASIC METABOLIC PANEL
Anion gap: 10 (ref 5–15)
BUN: 10 mg/dL (ref 6–20)
CO2: 25 mmol/L (ref 22–32)
Calcium: 7.9 mg/dL — ABNORMAL LOW (ref 8.9–10.3)
Chloride: 101 mmol/L (ref 98–111)
Creatinine, Ser: 1.08 mg/dL (ref 0.61–1.24)
GFR calc Af Amer: >60 mL/min (ref >60)
GFR calc non Af Amer: >60 mL/min (ref >60)
Glucose, Bld: 105 mg/dL — ABNORMAL HIGH (ref 70–99)
Potassium: 3.7 mmol/L (ref 3.5–5.1)
Sodium: 136 mmol/L (ref 135–145)

## 2019-01-22 MED ORDER — HYDROMORPHONE HCL 1 MG/ML IJ SOLN
0.5000 mg | Freq: Once | INTRAMUSCULAR | Status: AC
  Administered 2019-01-22: 12:00:00 0.5 mg via INTRAVENOUS

## 2019-01-22 NOTE — Progress Notes (Signed)
     Subjective: 24 year old male who presented to the ED on 1/16 with increased pain and swelling of right lower extremity with intermittent fevers. He was previously seen at Urgent care for a small abscess of the right lateral knee on 1/12 and was started on Augmentin. He followed up at urgent care on 1/14 where the abscess was drained and packed and he was started on Bactrim. The culture grew MRSA. After presenting to the ED on 1/16 he was started on vancomycin and Zosyn.1/17 patient was taken to the OR for excisional debridement of right thigh abscess with application of wound vac by Dr. Dion Saucier and incision and drainage or right gluteal abscess by Dr. Cliffton Asters.  No acute events overnight. Today patient reports pain to be moderate. Pain is worse in buttock than knee. Reports that his right thigh feels significantly less swollen. He has not moved from bed since surgery yesterday.   Objective:   VITALS:   Vitals:   01/21/19 1822 01/21/19 2026 01/22/19 0000 01/22/19 0400  BP: (!) 144/61 125/64 131/64 132/74  Pulse: (!) 103  94   Resp: 20 16 16 14   Temp: 98.4 F (36.9 C) 99.3 F (37.4 C) 98.4 F (36.9 C)   TempSrc:  Oral Axillary   SpO2: 97% 94% 96% 94%  Weight:      Height:       General: Alert, lying in bed. Comfortable at rest. In no distress. Mental status: Alert and Oriented x3 Respiratory: No cyanosis, no use of accessory musculature Cardiovascular: No pedal edema GI: Abdomen is soft and non-tender, non-distended.\ Skin: Operative dressings not removed for exam. No erythema extending beyond operative dressings.  MSK: RLE: RLE operative dressing intact from proximal thigh to ankle. No drainage noted. Wound vac in place and functioning with drainage noted in canister. Full ROM at ankle. Able to flex and extend all toes of right foot. Distal sensation and capillary refill intact. Right thigh tender to palpation. Swelling has decreased per patient report.   Lab Results  Component  Value Date   WBC 24.7 (H) 01/20/2019   HGB 17.7 (H) 01/20/2019   HCT 53.1 (H) 01/20/2019   MCV 90.6 01/20/2019   PLT 324 01/20/2019   BMET    Component Value Date/Time   NA 136 01/22/2019 0234   K 3.7 01/22/2019 0234   CL 101 01/22/2019 0234   CO2 25 01/22/2019 0234   GLUCOSE 105 (H) 01/22/2019 0234   BUN 10 01/22/2019 0234   CREATININE 1.08 01/22/2019 0234   CALCIUM 7.9 (L) 01/22/2019 0234   GFRNONAA >60 01/22/2019 0234   GFRAA >60 01/22/2019 0234     Assessment/Plan: 1 Day Post-Op   Principal Problem:   Necrotizing fasciitis (HCC), right thigh Active Problems:   Abscess of right leg  Necrotizing fasciitis of right thigh, s/p excisional debridement Right gluteal abscess, s/p incision and drainage - started on Zosyn and vancomycin on 1/16, clindamycin started on 1/17 - continue wound vac - Plan for repeat I&D of right thigh abscess tomorrow - NPO after midnight  2/17 01/22/2019, 7:19 AM   01/24/2019, MD Cell [phone]

## 2019-01-22 NOTE — Progress Notes (Signed)
1 Day Post-Op  Subjective: CC: Right leg pain Patient reports pain of the right buttock and right leg. Ortho plans to take him to the OR tomorrow. He reports he is tolerating his diet without n/v. No abdominal pain, cp, sob. He is passing flatus. No BM since admission. He has not gotten out of bed since returning to the OR.   Patients HIV negative. A1c 5.1. Denies illicit drug use. 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 Reports he injects into his left buttock every time.   He is an Personnel officer. Lives at home with his GF.   Objective: Vital signs in last 24 hours: Temp:  [98.4 F (36.9 C)-99.3 F (37.4 C)] 98.4 F (36.9 C) (01/18 0757) Pulse Rate:  [94-103] 94 (01/18 0757) Resp:  [13-22] 13 (01/18 1102) BP: (125-144)/(61-83) 131/83 (01/18 0757) SpO2:  [94 %-97 %] 95 % (01/18 1102) Last BM Date: (days per pt)  Intake/Output from previous day: 01/17 0701 - 01/18 0700 In: 3916.4 [I.V.:2852.9; IV Piggyback:1063.5] Out: 1570 [Urine:1250; Drains:240; Blood:80] Intake/Output this shift: Total I/O In: -  Out: 1200 [Urine:1000; Other:200]  PE: Gen:  Alert, NAD, pleasant Card:  RRR, no M/G/R heard Pulm:  CTAB, no W/R/R, effort normal Abd: Soft, NT/ND, +BS GU: Chaperone present, RN. Right perianal/gluteal incisions with 2 penrose drains in place through counter incisions. Packing removed. No further drainage from incisions. Area surrounding without erythema or heat. Minimal induration.  Ext:  No LLE edema. Right LE with ace bandage in place. Wound vac with bloody drainage.  Psych: A&Ox3   Lab Results:  Recent Labs    01/20/19 1530  WBC 24.7*  HGB 17.7*  HCT 53.1*  PLT 324   BMET Recent Labs    01/20/19 1530 01/22/19 0234  NA 134* 136  K 3.8 3.7  CL 94* 101  CO2 26 25  GLUCOSE 105* 105*  BUN 13 10  CREATININE 1.39* 1.08  CALCIUM 8.9 7.9*   PT/INR Recent Labs    01/20/19 1530  LABPROT 14.2  INR 1.1   CMP     Component Value Date/Time   NA 136  01/22/2019 0234   K 3.7 01/22/2019 0234   CL 101 01/22/2019 0234   CO2 25 01/22/2019 0234   GLUCOSE 105 (H) 01/22/2019 0234   BUN 10 01/22/2019 0234   CREATININE 1.08 01/22/2019 0234   CALCIUM 7.9 (L) 01/22/2019 0234   PROT 7.9 01/20/2019 1530   ALBUMIN 2.9 (L) 01/20/2019 1530   AST 31 01/20/2019 1530   ALT 28 01/20/2019 1530   ALKPHOS 124 01/20/2019 1530   BILITOT 1.0 01/20/2019 1530   GFRNONAA >60 01/22/2019 0234   GFRAA >60 01/22/2019 0234   Lipase     Component Value Date/Time   LIPASE 25 10/18/2009 2121       Studies/Results: CT FEMUR RIGHT W CONTRAST  Result Date: 01/20/2019 CLINICAL DATA:  Right thigh swelling EXAM: CT OF THE LOWER RIGHT EXTREMITY WITH CONTRAST TECHNIQUE: Multidetector CT imaging of the lower right extremity was performed according to the standard protocol following intravenous contrast administration. COMPARISON:  X-ray 01/20/2019 CONTRAST:  OMNIPAQUE IOHEXOL 300 MG/ML  SOLN FINDINGS: Bones/Joint/Cartilage No acute fracture or dislocation. No periostitis or cortical destruction. No lytic or sclerotic bony lesion. Joint spaces of the right hip, right knee, SI joints, and pubic symphysis are maintained without evidence of arthropathy. No appreciable hip joint effusion. No knee joint effusion. Ligaments Suboptimally assessed by CT. Muscles and Tendons Normal muscle  bulk without atrophy or fatty infiltration. No intramuscular fluid collection or evidence of intramuscular abscess. Tendinous structures about the lower extremity appear intact within the limitations of CT. Soft tissues There is extensive induration and ill-defined fluid density within the soft tissues of the right perineum and inferior gluteal region measuring approximately 12 x 4.5 x 9 cm (series 5, image 123; series 9, image 99). There is no well-defined or rim enhancing wall. No associated soft tissue gas. There is overlying skin thickening. Inflammatory changes within this region do not appear  to involve scrotal soft tissues. Overlying the anterolateral aspect of the distal femur just proximal to the knee, there is extensive skin thickening with underlying induration within the subcutaneous soft tissues. Extensive fluid accumulates within the subcutaneous tissues of the mid to distal femur, which is most pronounced anterior laterally. There is 1 tiny focus of low attenuation seen within the soft tissues adjacent to the lateral femoral condyle (series 5, image 298) which could reflect a small focus of noninflamed normal fat versus a tiny locule of gas. There are somewhat elongated areas of increased attenuation within this region at the level of the distal femoral metaphysis (series 5, image 285; series 9, image 53). There is no appreciable deep fascial fluid collections. IMPRESSION: 1. Extensive subcutaneous inflammatory changes with edema and fluid accumulating within the subcutaneous tissues of the mid to distal right femur, most pronounced anterolaterally. No drainable or rim enhancing fluid component. There is one tiny focus of low attenuation seen within the soft tissues adjacent to the lateral femoral condyle, which could reflect a small focus of non-inflamed normal fat versus a tiny locule of gas. Overall, findings are suggestive of an aggressive soft tissue infection. 2. There are somewhat elongated areas of increased attenuation within this region laterally at the level of the distal femoral metaphysis possibly representing superficial thrombophlebitis. 3. Extensive induration and ill-defined fluid density within the soft tissues of the right perineum and inferior gluteal region measuring approximately 12 x 4.5 x 9 cm without well-defined or rim enhancing wall, suggestive of phlegmonous change. No associated soft tissue gas. 4. There are no appreciable deep fascial fluid collections. Please note that necrotizing fasciitis is a clinical diagnosis and cannot be excluded by imaging alone. These  results were called by telephone at the time of interpretation on 01/20/2019 at 5:55 pm to provider [facility] , who verbally acknowledged these results. Electronically Signed   By: Davina Poke D.O.   On: 01/20/2019 17:58   DG Femur Min 2 Views Right  Result Date: 01/20/2019 CLINICAL DATA:  Right femoral pain. EXAM: RIGHT FEMUR 2 VIEWS COMPARISON:  None. FINDINGS: There is no evidence of fracture or other focal bone lesions. No evidence of soft tissue emphysema. Diffuse subcutaneous soft tissue swelling noted. IMPRESSION: No acute fracture or dislocation identified about the right femur. Electronically Signed   By: Fidela Salisbury M.D.   On: 01/20/2019 16:01    Anti-infectives: Anti-infectives (From admission, onward)   Start     Dose/Rate Route Frequency Ordered Stop   01/21/19 0845  vancomycin (VANCOCIN) powder  Status:  Discontinued       As needed 01/21/19 0924 01/21/19 0951   01/21/19 0600  ceFAZolin (ANCEF) IVPB 2g/100 mL premix     2 g 200 mL/hr over 30 Minutes Intravenous On call to O.R. 01/20/19 2149 01/21/19 0802   01/21/19 0000  clindamycin (CLEOCIN) IVPB 900 mg     900 mg 100 mL/hr over 30 Minutes Intravenous Every  8 hours 01/20/19 1951     01/20/19 2200  piperacillin-tazobactam (ZOSYN) IVPB 3.375 g     3.375 g 12.5 mL/hr over 240 Minutes Intravenous Every 8 hours 01/20/19 1534     01/20/19 2200  piperacillin-tazobactam (ZOSYN) IVPB 3.375 g  Status:  Discontinued     3.375 g 12.5 mL/hr over 240 Minutes Intravenous Every 8 hours 01/20/19 1951 01/20/19 2019   01/20/19 1545  piperacillin-tazobactam (ZOSYN) IVPB 3.375 g     3.375 g 100 mL/hr over 30 Minutes Intravenous  Once 01/20/19 1534 01/20/19 1730   01/20/19 1530  vancomycin (VANCOREADY) IVPB 1250 mg/250 mL     1,250 mg 166.7 mL/hr over 90 Minutes Intravenous Every 8 hours 01/20/19 1526     01/20/19 1530  clindamycin (CLEOCIN) IVPB 600 mg     600 mg 100 mL/hr over 30 Minutes Intravenous  Once 01/20/19 1529  01/20/19 1730       Assessment/Plan Right thigh abscess with necrotizing fasciitis   - s/p Excisional debridement, right thigh, extending down to the lateral aspect of the knee and application of 14 cm wound VAC - Dr. Dion Saucier - 01/21/2019.  - Ortho plans to take back to the OR in the AM.  - Continue IV abx   Right gluteal abscess  - s/p Incision and drainage of right gluteal abscess, sharp debridement of necrotic subcutaneous fat, 4cm2, placement of 2 penrose drains through counter incisions - Dr. Cliffton Asters - 01/21/2019 - POD #1 - Packing removed - Continue IV abx  - PT eval   Steroid Use  FEN - Reg, NPO at midnight, IVF  VTE - SCDs, Lovenox  ID - Clinda/Vanc/Zosyn 1/16 >> Cx's pending. Am labs.    LOS: 2 days    Jacinto Halim , [facility] 01/22/2019, 11:31 AM Please see Amion for pager number during day hours 7:00am-4:30pm

## 2019-01-22 NOTE — Progress Notes (Signed)
Pharmacy Antibiotic Note  Brian Richardson is a 24 y.o. male admitted on 01/20/2019 with cellulitis.  Pharmacy has been consulted for zosyn and Vancomycin dosing.  Renal function is stable.  Remains afebrile.  Height: 6' (182.9 cm) Weight: 235 lb (106.6 kg) IBW/kg (Calculated) : 77.6  Temp (24hrs), Avg:98.6 F (37 C), Min:98.4 F (36.9 C), Max:99.3 F (37.4 C)  Recent Labs  Lab 01/20/19 1530 01/20/19 1757 01/20/19 1950 01/22/19 0234  WBC 24.7*  --   --   --   CREATININE 1.39*  --   --  1.08  LATICACIDVEN 2.2* 2.9* 2.1*  --     Estimated Creatinine Clearance: 131.9 mL/min (by C-G formula based on SCr of 1.08 mg/dL).    No Known Allergies  Antimicrobials this admission: 1/16 Zosyn >>1/18 1/16 Clindamycin >>1/18 1/16 Vancomycin >>     Dose adjustments this admission:   Microbiology results: 1/14: abscess culture: MRSA clinda resistant 1/16 fluid from knee: ngtd (on abx) 1/16 Bcx: ngtd 1/17 acid fast smear 1/17 thing fluid: s.aureus 1/17 MRSA PCR: positive   Plan:  Discussed antibiotic regimen with Leary Roca Discontinue Zosyn and clindamycin  Continue Vancomycin 1250mg  IV q8h Check vancomycin peak/trough on 1/19  Thank you for allowing pharmacy to be a part of this patient's care.  2/19 PharmD. BCPS 01/22/2019 1:54 PM

## 2019-01-23 ENCOUNTER — Encounter (HOSPITAL_COMMUNITY): Admission: EM | Disposition: A | Discharge status: Home / Self Care | Payer: Self-pay | Source: Home / Self Care

## 2019-01-23 ENCOUNTER — Inpatient Hospital Stay (HOSPITAL_COMMUNITY): Payer: Commercial Managed Care - PPO | Admitting: Anesthesiology

## 2019-01-23 ENCOUNTER — Encounter (HOSPITAL_COMMUNITY): Payer: Self-pay

## 2019-01-23 HISTORY — PX: I & D EXTREMITY: SHX5045

## 2019-01-23 LAB — BODY FLUID CULTURE

## 2019-01-23 LAB — CBC
HCT: 44.2 % (ref 39.0–52.0)
Hemoglobin: 14.6 g/dL (ref 13.0–17.0)
MCH: 29.6 pg (ref 26.0–34.0)
MCHC: 33 g/dL (ref 30.0–36.0)
MCV: 89.7 fL (ref 80.0–100.0)
Platelets: 335 10*3/uL (ref 150–400)
RBC: 4.93 MIL/uL (ref 4.22–5.81)
RDW: 13.3 % (ref 11.5–15.5)
WBC: 17.7 10*3/uL — ABNORMAL HIGH (ref 4.0–10.5)
nRBC: 0 % (ref 0.0–0.2)

## 2019-01-23 LAB — BASIC METABOLIC PANEL
Anion gap: 11 (ref 5–15)
BUN: 9 mg/dL (ref 6–20)
CO2: 24 mmol/L (ref 22–32)
Calcium: 8.5 mg/dL — ABNORMAL LOW (ref 8.9–10.3)
Chloride: 103 mmol/L (ref 98–111)
Creatinine, Ser: 0.91 mg/dL (ref 0.61–1.24)
GFR calc Af Amer: >60 mL/min (ref >60)
GFR calc non Af Amer: >60 mL/min (ref >60)
Glucose, Bld: 127 mg/dL — ABNORMAL HIGH (ref 70–99)
Potassium: 4 mmol/L (ref 3.5–5.1)
Sodium: 138 mmol/L (ref 135–145)

## 2019-01-23 LAB — VANCOMYCIN, TROUGH: Vancomycin Tr: 31 ug/mL (ref 15–20)

## 2019-01-23 LAB — VANCOMYCIN, PEAK: Vancomycin Pk: 31 ug/mL (ref 30–40)

## 2019-01-23 SURGERY — IRRIGATION AND DEBRIDEMENT EXTREMITY
Anesthesia: General | Laterality: Right

## 2019-01-23 MED ORDER — FENTANYL CITRATE (PF) 100 MCG/2ML IJ SOLN: 25.0000 ug | INTRAMUSCULAR | Status: DC | PRN

## 2019-01-23 MED ORDER — DEXMEDETOMIDINE HCL 200 MCG/2ML IV SOLN
INTRAVENOUS | Status: DC | PRN
  Administered 2019-01-23: 8 ug via INTRAVENOUS
  Administered 2019-01-23: 4 ug via INTRAVENOUS

## 2019-01-23 MED ORDER — CEFAZOLIN SODIUM-DEXTROSE 2-3 GM-%(50ML) IV SOLR
INTRAVENOUS | Status: DC | PRN
  Administered 2019-01-23: 2 g via INTRAVENOUS

## 2019-01-23 MED ORDER — FENTANYL CITRATE (PF) 250 MCG/5ML IJ SOLN
INTRAMUSCULAR | Status: AC
  Filled 2019-01-23: qty 5

## 2019-01-23 MED ORDER — FENTANYL CITRATE (PF) 100 MCG/2ML IJ SOLN
INTRAMUSCULAR | Status: DC | PRN
  Administered 2019-01-23: 50 ug via INTRAVENOUS
  Administered 2019-01-23 (×2): 100 ug via INTRAVENOUS

## 2019-01-23 MED ORDER — ONDANSETRON HCL 4 MG/2ML IJ SOLN
INTRAMUSCULAR | Status: DC | PRN
  Administered 2019-01-23: 4 mg via INTRAVENOUS

## 2019-01-23 MED ORDER — PROPOFOL 10 MG/ML IV BOLUS
INTRAVENOUS | Status: AC
  Filled 2019-01-23: qty 20

## 2019-01-23 MED ORDER — 0.9 % SODIUM CHLORIDE (POUR BTL) OPTIME
TOPICAL | Status: DC | PRN
  Administered 2019-01-23: 1000 mL

## 2019-01-23 MED ORDER — DEXAMETHASONE SODIUM PHOSPHATE 10 MG/ML IJ SOLN
INTRAMUSCULAR | Status: AC
  Filled 2019-01-23: qty 1

## 2019-01-23 MED ORDER — ACETAMINOPHEN 500 MG PO TABS
1000.0000 mg | ORAL_TABLET | Freq: Once | ORAL | Status: AC
  Administered 2019-01-23: 15:00:00 1000 mg via ORAL

## 2019-01-23 MED ORDER — CHLORHEXIDINE GLUCONATE 4 % EX LIQD
60.0000 mL | Freq: Once | CUTANEOUS | Status: AC
  Administered 2019-01-23: 4 via TOPICAL
  Filled 2019-01-23: qty 60

## 2019-01-23 MED ORDER — BUPIVACAINE HCL (PF) 0.25 % IJ SOLN
INTRAMUSCULAR | Status: AC
  Filled 2019-01-23: qty 30

## 2019-01-23 MED ORDER — ALBUTEROL SULFATE HFA 108 (90 BASE) MCG/ACT IN AERS
INHALATION_SPRAY | RESPIRATORY_TRACT | Status: AC
  Filled 2019-01-23: qty 6.7

## 2019-01-23 MED ORDER — ALBUTEROL SULFATE HFA 108 (90 BASE) MCG/ACT IN AERS
INHALATION_SPRAY | RESPIRATORY_TRACT | Status: DC | PRN
  Administered 2019-01-23: 6 via RESPIRATORY_TRACT

## 2019-01-23 MED ORDER — ENSURE PRE-SURGERY PO LIQD
296.0000 mL | Freq: Once | ORAL | Status: AC
  Administered 2019-01-23: 06:00:00 296 mL via ORAL
  Filled 2019-01-23: qty 296

## 2019-01-23 MED ORDER — ONDANSETRON HCL 4 MG/2ML IJ SOLN
INTRAMUSCULAR | Status: AC
  Filled 2019-01-23: qty 2

## 2019-01-23 MED ORDER — MIDAZOLAM HCL 2 MG/2ML IJ SOLN
INTRAMUSCULAR | Status: AC
  Filled 2019-01-23: qty 2

## 2019-01-23 MED ORDER — LIDOCAINE 2% (20 MG/ML) 5 ML SYRINGE
INTRAMUSCULAR | Status: DC | PRN
  Administered 2019-01-23: 100 mg via INTRAVENOUS

## 2019-01-23 MED ORDER — LIDOCAINE 2% (20 MG/ML) 5 ML SYRINGE
INTRAMUSCULAR | Status: AC
  Filled 2019-01-23: qty 5

## 2019-01-23 MED ORDER — MUPIROCIN 2 % EX OINT: 1.0000 "application " | TOPICAL_OINTMENT | Freq: Two times a day (BID) | CUTANEOUS | Status: DC

## 2019-01-23 MED ORDER — SODIUM CHLORIDE 0.9 % IR SOLN
Status: DC | PRN
  Administered 2019-01-23: 3000 mL

## 2019-01-23 MED ORDER — CELECOXIB 400 MG PO CAPS: 400.0000 mg | ORAL_CAPSULE | Freq: Once | ORAL | Status: DC

## 2019-01-23 MED ORDER — CHLORHEXIDINE GLUCONATE CLOTH 2 % EX PADS
6.0000 | MEDICATED_PAD | Freq: Every day | CUTANEOUS | Status: DC
  Administered 2019-01-23 – 2019-01-24 (×2): 6 via TOPICAL

## 2019-01-23 MED ORDER — BUPIVACAINE HCL (PF) 0.25 % IJ SOLN
INTRAMUSCULAR | Status: DC | PRN
  Administered 2019-01-23: 20 mL

## 2019-01-23 MED ORDER — DIPHENHYDRAMINE HCL 50 MG/ML IJ SOLN
INTRAMUSCULAR | Status: DC | PRN
  Administered 2019-01-23: 12.5 mg via INTRAVENOUS

## 2019-01-23 MED ORDER — DIPHENHYDRAMINE HCL 50 MG/ML IJ SOLN
INTRAMUSCULAR | Status: AC
  Filled 2019-01-23: qty 1

## 2019-01-23 MED ORDER — PROPOFOL 10 MG/ML IV BOLUS
INTRAVENOUS | Status: DC | PRN
  Administered 2019-01-23: 200 mg via INTRAVENOUS

## 2019-01-23 MED ORDER — DEXAMETHASONE SODIUM PHOSPHATE 4 MG/ML IJ SOLN
INTRAMUSCULAR | Status: DC | PRN
  Administered 2019-01-23: 10 mg via INTRAVENOUS

## 2019-01-23 MED ORDER — MIDAZOLAM HCL 5 MG/5ML IJ SOLN
INTRAMUSCULAR | Status: DC | PRN
  Administered 2019-01-23: 2 mg via INTRAVENOUS

## 2019-01-23 MED ORDER — LACTATED RINGERS IV SOLN: INTRAVENOUS | Status: DC

## 2019-01-23 MED ORDER — PROMETHAZINE HCL 25 MG/ML IJ SOLN: 6.2500 mg | INTRAMUSCULAR | Status: DC | PRN

## 2019-01-23 SURGICAL SUPPLY — 58 items
BANDAGE ESMARK 6X9 LF (GAUZE/BANDAGES/DRESSINGS) IMPLANT
BLADE SURG 10 STRL SS (BLADE) ×3 IMPLANT
BNDG COHESIVE 4X5 TAN STRL (GAUZE/BANDAGES/DRESSINGS) ×3 IMPLANT
BNDG ELASTIC 4X5.8 VLCR STR LF (GAUZE/BANDAGES/DRESSINGS) ×3 IMPLANT
BNDG ELASTIC 6X10 VLCR STRL LF (GAUZE/BANDAGES/DRESSINGS) ×2 IMPLANT
BNDG ELASTIC 6X5.8 VLCR STR LF (GAUZE/BANDAGES/DRESSINGS) ×3 IMPLANT
BNDG ESMARK 4X9 LF (GAUZE/BANDAGES/DRESSINGS) IMPLANT
BNDG ESMARK 6X9 LF (GAUZE/BANDAGES/DRESSINGS)
BNDG GAUZE ELAST 4 BULKY (GAUZE/BANDAGES/DRESSINGS) ×3 IMPLANT
CANISTER WOUNDNEG PRESSURE 500 (CANNISTER) ×2 IMPLANT
CONT SPEC 4OZ CLIKSEAL STRL BL (MISCELLANEOUS) IMPLANT
COVER SURGICAL LIGHT HANDLE (MISCELLANEOUS) ×3 IMPLANT
COVER WAND RF STERILE (DRAPES) ×3 IMPLANT
CUFF TOURN SGL LL 12 NO SLV (MISCELLANEOUS) IMPLANT
CUFF TOURN SGL QUICK 34 (TOURNIQUET CUFF)
CUFF TRNQT CYL 34X4.125X (TOURNIQUET CUFF) IMPLANT
DRAPE SURG 17X23 STRL (DRAPES) IMPLANT
DRAPE U-SHAPE 47X51 STRL (DRAPES) IMPLANT
DRSG ADAPTIC 3X8 NADH LF (GAUZE/BANDAGES/DRESSINGS) ×2 IMPLANT
DRSG PAD ABDOMINAL 8X10 ST (GAUZE/BANDAGES/DRESSINGS) ×3 IMPLANT
DURAPREP 26ML APPLICATOR (WOUND CARE) ×3 IMPLANT
ELECT REM PT RETURN 9FT ADLT (ELECTROSURGICAL)
ELECTRODE REM PT RTRN 9FT ADLT (ELECTROSURGICAL) IMPLANT
EVACUATOR 1/8 PVC DRAIN (DRAIN) IMPLANT
FACESHIELD WRAPAROUND (MASK) ×3 IMPLANT
FACESHIELD WRAPAROUND OR TEAM (MASK) ×1 IMPLANT
GAUZE SPONGE 4X4 12PLY STRL (GAUZE/BANDAGES/DRESSINGS) ×3 IMPLANT
GAUZE XEROFORM 1X8 LF (GAUZE/BANDAGES/DRESSINGS) ×3 IMPLANT
GLOVE BIO SURGEON STRL SZ7.5 (GLOVE) ×6 IMPLANT
GLOVE BIOGEL PI IND STRL 8 (GLOVE) ×2 IMPLANT
GLOVE BIOGEL PI INDICATOR 8 (GLOVE) ×4
GOWN STRL REUS W/ TWL LRG LVL3 (GOWN DISPOSABLE) ×3 IMPLANT
GOWN STRL REUS W/TWL LRG LVL3 (GOWN DISPOSABLE) ×6
HANDPIECE INTERPULSE COAX TIP (DISPOSABLE)
KIT BASIN OR (CUSTOM PROCEDURE TRAY) ×3 IMPLANT
KIT PREVENA INCISION MGT 13 (CANNISTER) ×2 IMPLANT
KIT TURNOVER KIT B (KITS) ×3 IMPLANT
MANIFOLD NEPTUNE II (INSTRUMENTS) ×3 IMPLANT
NDL HYPO 25GX1X1/2 BEV (NEEDLE) IMPLANT
NEEDLE HYPO 25GX1X1/2 BEV (NEEDLE) IMPLANT
NS IRRIG 1000ML POUR BTL (IV SOLUTION) ×3 IMPLANT
PACK ORTHO EXTREMITY (CUSTOM PROCEDURE TRAY) ×3 IMPLANT
PAD ARMBOARD 7.5X6 YLW CONV (MISCELLANEOUS) ×6 IMPLANT
PADDING CAST COTTON 6X4 STRL (CAST SUPPLIES) ×2 IMPLANT
SET CYSTO W/LG BORE CLAMP LF (SET/KITS/TRAYS/PACK) IMPLANT
SET HNDPC FAN SPRY TIP SCT (DISPOSABLE) IMPLANT
SPONGE LAP 18X18 RF (DISPOSABLE) IMPLANT
STOCKINETTE IMPERVIOUS 9X36 MD (GAUZE/BANDAGES/DRESSINGS) ×3 IMPLANT
SUT ETHILON 3 0 PS 1 (SUTURE) IMPLANT
SUT PDS AB 2-0 CT1 27 (SUTURE) IMPLANT
SWAB CULTURE ESWAB REG 1ML (MISCELLANEOUS) IMPLANT
SYR CONTROL 10ML LL (SYRINGE) IMPLANT
TOWEL GREEN STERILE (TOWEL DISPOSABLE) ×3 IMPLANT
TOWEL GREEN STERILE FF (TOWEL DISPOSABLE) ×3 IMPLANT
TUBE CONNECTING 12'X1/4 (SUCTIONS) ×1
TUBE CONNECTING 12X1/4 (SUCTIONS) ×2 IMPLANT
UNDERPAD 30X30 (UNDERPADS AND DIAPERS) ×3 IMPLANT
YANKAUER SUCT BULB TIP NO VENT (SUCTIONS) ×3 IMPLANT

## 2019-01-23 NOTE — Anesthesia Preprocedure Evaluation (Addendum)
Anesthesia Evaluation  Patient identified by MRN, date of birth, ID band Patient awake    Reviewed: Allergy & Precautions, H&P , NPO status , Patient's Chart, lab work & pertinent test results  History of Anesthesia Complications (+) PONV and history of anesthetic complications  Airway Mallampati: III  TM Distance: >3 FB Neck ROM: Full    Dental no notable dental hx. (+) Dental Advisory Given   Pulmonary asthma ,    Pulmonary exam normal        Cardiovascular negative cardio ROS Normal cardiovascular exam     Neuro/Psych negative neurological ROS  negative psych ROS   GI/Hepatic negative GI ROS, Neg liver ROS,   Endo/Other  negative endocrine ROS  Renal/GU negative Renal ROS  negative genitourinary   Musculoskeletal   Abdominal   Peds  Hematology negative hematology ROS (+)   Anesthesia Other Findings   Reproductive/Obstetrics negative OB ROS                            Anesthesia Physical  Anesthesia Plan  ASA: II  Anesthesia Plan: General   Post-op Pain Management:    Induction: Intravenous  PONV Risk Score and Plan: 4 or greater and Ondansetron, Midazolam, Diphenhydramine and Treatment may vary due to age or medical condition  Airway Management Planned: Oral ETT and LMA  Additional Equipment:   Intra-op Plan:   Post-operative Plan: Extubation in OR  Informed Consent: I have reviewed the patients History and Physical, chart, labs and discussed the procedure including the risks, benefits and alternatives for the proposed anesthesia with the patient or authorized representative who has indicated his/her understanding and acceptance.     Dental advisory given  Plan Discussed with: CRNA and Anesthesiologist  Anesthesia Plan Comments:        Anesthesia Quick Evaluation

## 2019-01-23 NOTE — Evaluation (Signed)
Physical Therapy Evaluation Patient Details Name: Brian Richardson MRN: [id number] DOB: 11/08/1995 Today's Date: 01/23/2019   History of Present Illness  Pt is a 24 y.o. male admitted 01/20/19 with unprovoked onset of R knee pain and swelling (had knee drainage in urgent care 1/12). Worked up for R thigh and gluteal abscess with necrotizing fasciitis, steroid use. S/p debridement and wound vac application 6/54. PMH includes asthma, R lazy eye.    Clinical Impression  Pt presents with an overall decrease in functional mobility secondary to above. PTA, pt independent, works and active, enjoys weight lifting. Educ on precautions, positioning, therex, and importance of mobility. Today, pt able to initiate transfer and gait training with RW, good baseline strength and moving well despite significant pain. Pt would benefit from continued acute PT services to maximize functional mobility and independence prior to d/c with outpatient orthopedic PT services for return to sport activities.     Follow Up Recommendations Outpatient PT    Equipment Recommendations  (TBD - RW vs. crutches)    Recommendations for Other Services       Precautions / Restrictions Precautions Precautions: Fall Precaution Comments: RLE wound vac      Mobility  Bed Mobility Overal bed mobility: Modified Independent             General bed mobility comments: Increased time and effort, no physical assist required; c/o dizziness which subsided with sitting rest at EOB  Transfers Overall transfer level: Modified independent Equipment used: Rolling walker (2 wheeled)             General transfer comment: Increased time and effort  Ambulation/Gait Ambulation/Gait assistance: Supervision Gait Distance (Feet): 22 Feet Assistive device: Rolling walker (2 wheeled) Gait Pattern/deviations: Step-to pattern;Decreased weight shift to right;Trunk flexed;Antalgic   Gait velocity interpretation: <1.31 ft/sec, indicative of  household ambulator General Gait Details: Slow, antalgic gait with RW, initial hop-to on LLE, pt encouraged to attempt WBAT thru RLE to allow for heel cord stretch, pt tolerated well. Interested in crutch training next session  Stairs            Wheelchair Mobility    Modified Rankin (Stroke Patients Only)       Balance Overall balance assessment: Needs assistance   Sitting balance-Leahy Scale: Good       Standing balance-Leahy Scale: Poor Standing balance comment: Reliant on at least single UE support                             Pertinent Vitals/Pain Pain Assessment: Faces Faces Pain Scale: Hurts even more Pain Location: RLE Pain Descriptors / Indicators: Grimacing;Guarding;Moaning Pain Intervention(s): Monitored during session;Premedicated before session    Home Living Family/patient expects to be discharged to:: Private residence Living Arrangements: Alone Available Help at Discharge: Family;Friend(s);Available PRN/intermittently Type of Home: House Home Access: Level entry     Home Layout: Two level Home Equipment: None Additional Comments: Plans to d/c to girlfriend's house; home set-up for her house    Prior Function Level of Independence: Independent         Comments: Works as Clinical biochemist, [employer]        Extremity/Trunk Assessment   Upper Extremity Assessment Upper Extremity Assessment: Overall WFL for tasks assessed    Lower Extremity Assessment Lower Extremity Assessment: RLE deficits/detail RLE Deficits / Details: RLE ace wrap from butt to midfoot, moving indep although limited by pain; able to  stretch heel to ground while standing, hip at least 3/5 RLE: Unable to fully assess due to pain;Unable to fully assess due to immobilization    Cervical / Trunk Assessment Cervical / Trunk Assessment: Normal  Communication   Communication: No difficulties  Cognition Arousal/Alertness:  Awake/alert Behavior During Therapy: WFL for tasks assessed/performed Overall Cognitive Status: Within Functional Limits for tasks assessed                                        General Comments      Exercises Other Exercises Other Exercises: Standing calf stretch, encouraged long sitting/supine calf stretch and hamstring stretch   Assessment/Plan    PT Assessment Patient needs continued PT services  PT Problem List Decreased strength;Decreased range of motion;Decreased balance;Decreased mobility;Decreased knowledge of use of DME;Decreased knowledge of precautions;Pain       PT Treatment Interventions DME instruction;Gait training;Stair training;Functional mobility training;Therapeutic activities;Therapeutic exercise;Balance training;Patient/family education    PT Goals (Current goals can be found in the Care Plan section)  Acute Rehab PT Goals Patient Stated Goal: Decreased pain, get back to lifting PT Goal Formulation: With patient Time For Goal Achievement: 02/06/19 Potential to Achieve Goals: Good    Frequency Min 5X/week   Barriers to discharge        Co-evaluation               AM-PAC PT "6 Clicks" Mobility  Outcome Measure Help needed turning from your back to your side while in a flat bed without using bedrails?: None Help needed moving from lying on your back to sitting on the side of a flat bed without using bedrails?: None Help needed moving to and from a bed to a chair (including a wheelchair)?: None Help needed standing up from a chair using your arms (e.g., wheelchair or bedside chair)?: None Help needed to walk in hospital room?: A Little Help needed climbing 3-5 steps with a railing? : A Little 6 Click Score: 22    End of Session   Activity Tolerance: Patient tolerated treatment well Patient left: in bed;with call bell/phone within reach Nurse Communication: Mobility status PT Visit Diagnosis: Other abnormalities of gait and  mobility (R26.89);Pain Pain - Right/Left: Right Pain - part of body: Leg    Time: 2376-2831 PT Time Calculation (min) (ACUTE ONLY): 35 min   Charges:   PT Evaluation $PT Eval Low Complexity: 1 Low PT Treatments $Therapeutic Exercise: 8-22 mins $Therapeutic Activity: 8-22 mins   Ina Homes, PT, DPT Acute Rehabilitation Services  Pager [phone] Office [phone]  Brian Richardson 01/23/2019, 12:32 PM

## 2019-01-23 NOTE — Anesthesia Postprocedure Evaluation (Signed)
Anesthesia Post Note  Patient: Brian Richardson  Procedure(s) Performed: REPEAT IRRIGATION AND DEBRIDEMENT RIGHT LOWER EXTREMITY (Right )     Patient location during evaluation: PACU Anesthesia Type: General Level of consciousness: sedated Pain management: pain level controlled Vital Signs Assessment: post-procedure vital signs reviewed and stable Respiratory status: spontaneous breathing and respiratory function stable Cardiovascular status: stable Postop Assessment: no apparent nausea or vomiting Anesthetic complications: no    Last Vitals:  Vitals:   01/23/19 1247 01/23/19 1301  BP: 140/66 (!) 143/63  Pulse: 97 98  Resp: 16 16  Temp:  (P) 36.7 C  SpO2: 95%     Last Pain:  Vitals:   01/23/19 1301  TempSrc: (P) Oral  PainSc:                  [name],[name]

## 2019-01-23 NOTE — Progress Notes (Signed)
Patient back to room 4E25 from PACU. VSS. Oriented to room and call light.  Ginette Otto, RN

## 2019-01-23 NOTE — Transfer of Care (Signed)
Immediate Anesthesia Transfer of Care Note  Patient: [name]  Procedure(s) Performed: REPEAT IRRIGATION AND DEBRIDEMENT RIGHT LOWER EXTREMITY (Right )  Patient Location: PACU  Anesthesia Type:General  Level of Consciousness: drowsy and patient cooperative  Airway & Oxygen Therapy: Patient Spontanous Breathing and Patient connected to nasal cannula oxygen  Post-op Assessment: Report given to RN, Post -op Vital signs reviewed and stable and Patient moving all extremities  Post vital signs: Reviewed and stable  Last Vitals:  Vitals Value Taken Time  BP    Temp    Pulse 110 01/23/19 1206  Resp 18 01/23/19 1206  SpO2 98 % 01/23/19 1206  Vitals shown include unvalidated device data.  Last Pain:  Vitals:   01/23/19 0811  TempSrc: Oral  PainSc: Asleep      Patients Stated Pain Goal: 2 (01/22/19 1809)  Complications: No apparent anesthesia complications

## 2019-01-23 NOTE — Progress Notes (Signed)
CRITICAL VALUE ALERT  Critical Value:  Critical Vancomycin Trough 31  Date & Time Notied:  01/23/2019 2:20PM  Provider Notified: Pharmacist Sheppard Coil    Orders Received/Actions taken: Will redo lab.   Ginette Otto, RN

## 2019-01-23 NOTE — Progress Notes (Signed)
2 Days Post-Op    CC: Pain/abscess right knee and buttocks  Subjective: Patient still extremely tender.  Packing is out from his I&D the buttocks.  Penrose drains in place.  There is some drainage on the dressing but it does not look too bad.  There is no cellulitis around the drain sites currently. His right thigh has a large Ace wrap going from the thigh down to the knee.  What skin is visible shows some erythema, and it is extremely hard for him to move secondary to pain. Objective: Vital signs in last 24 hours: Temp:  [98 F (36.7 C)-100.5 F (38.1 C)] 98 F (36.7 C) (01/19 0811) Pulse Rate:  [85-98] 91 (01/19 0811) Resp:  [11-20] 16 (01/19 0811) BP: (134-153)/(63-72) 144/69 (01/19 0811) SpO2:  [95 %-97 %] 96 % (01/19 0530) Last BM Date: (days per pt) 2450 IV 2500 urine 50 drain T-max 100.5 WBC 24.7>>17.7 No growth in the blood cultures MRSA aerobic culture MSSA anaerobic culture Intake/Output from previous day: 01/18 0701 - 01/19 0700 In: 2451.7 [I.V.:1602.8; IV Piggyback:848.9] Out: 2750 [Urine:2500; Drains:50] Intake/Output this shift: Total I/O In: -  Out: 500 [Urine:500]  General appearance: alert, cooperative and no distress Resp: clear to auscultation bilaterally GI: soft, non-tender; bowel sounds normal; no masses,  no organomegaly and Skin around the rectal I&D site is not erythematous still pretty tender.  There is still some drainage on the dressing.  Lab Results:  Recent Labs    01/20/19 1530 01/23/19 0709  WBC 24.7* 17.7*  HGB 17.7* 14.6  HCT 53.1* 44.2  PLT 324 335    BMET Recent Labs    01/22/19 0234 01/23/19 0709  NA 136 138  K 3.7 4.0  CL 101 103  CO2 25 24  GLUCOSE 105* 127*  BUN 10 9  CREATININE 1.08 0.91  CALCIUM 7.9* 8.5*   PT/INR Recent Labs    01/20/19 1530  LABPROT 14.2  INR 1.1    Recent Labs  Lab 01/20/19 1530  AST 31  ALT 28  ALKPHOS 124  BILITOT 1.0  PROT 7.9  ALBUMIN 2.9*     Lipase     Component  Value Date/Time   LIPASE 25 10/18/2009 2121     Medications: . acetaminophen  1,000 mg Oral Q6H  . docusate sodium  100 mg Oral BID  . enoxaparin (LOVENOX) injection  40 mg Subcutaneous Q24H  . methocarbamol  1,000 mg Oral Q8H    Assessment/Plan Right thigh abscess with necrotizing fasciitis   - s/p Excisional debridement, right thigh, extending down to the lateral aspect of the knee and application of 14 cm wound VAC - Dr. Mardelle Matte - 01/21/2019.  - Ortho plans to take back to the OR in the AM.  - Continue IV abx   Right gluteal abscess  - s/p Incision and drainage of right gluteal abscess, sharp debridement of necrotic subcutaneous fat, 4cm2, placement of 2 penrose drains through counter incisions - Dr. Dema Severin - 01/21/2019 - POD #3 - Packing removed - Continue IV abx/MRSA from the anaerobic wound culture  -Moderate staph from the aerobic anaerobic culture - PT eval   Steroid Use  FEN - Reg, NPO at midnight, IVF  VTE - SCDs, Lovenox  ID - Clinda/1/16 - 1/18;Zosyn 1/16-1/18; vancomycin 1/16 >> day 4  Plan: Ortho plans to take him back for I&D of the right thigh later today.  Once that is completed we will get him started on sitz baths for his gluteal I&D  site.       LOS: 3 days    Brian Richardson 01/23/2019 Please see Amion

## 2019-01-23 NOTE — Anesthesia Procedure Notes (Signed)
Procedure Name: LMA Insertion Date/Time: 01/23/2019 11:20 AM Performed by: Laruth Bouchard., CRNA Pre-anesthesia Checklist: Patient identified, Emergency Drugs available, Suction available, Patient being monitored and Timeout performed Patient Re-evaluated:Patient Re-evaluated prior to induction Oxygen Delivery Method: Circle system utilized Preoxygenation: Pre-oxygenation with 100% oxygen Induction Type: IV induction LMA: LMA inserted LMA Size: 5.0 Number of attempts: 1 Placement Confirmation: positive ETCO2 and breath sounds checked- equal and bilateral Tube secured with: Tape Dental Injury: Teeth and Oropharynx as per pre-operative assessment

## 2019-01-24 LAB — VANCOMYCIN, TROUGH: Vancomycin Tr: 11 ug/mL — ABNORMAL LOW (ref 15–20)

## 2019-01-24 LAB — CBC
HCT: 41.6 % (ref 39.0–52.0)
Hemoglobin: 14 g/dL (ref 13.0–17.0)
MCH: 30.5 pg (ref 26.0–34.0)
MCHC: 33.7 g/dL (ref 30.0–36.0)
MCV: 90.6 fL (ref 80.0–100.0)
Platelets: 362 10*3/uL (ref 150–400)
RBC: 4.59 MIL/uL (ref 4.22–5.81)
RDW: 13.5 % (ref 11.5–15.5)
WBC: 19.9 10*3/uL — ABNORMAL HIGH (ref 4.0–10.5)
nRBC: 0.1 % (ref 0.0–0.2)

## 2019-01-24 LAB — BASIC METABOLIC PANEL
Anion gap: 7 (ref 5–15)
BUN: 10 mg/dL (ref 6–20)
CO2: 24 mmol/L (ref 22–32)
Calcium: 8.2 mg/dL — ABNORMAL LOW (ref 8.9–10.3)
Chloride: 105 mmol/L (ref 98–111)
Creatinine, Ser: 0.91 mg/dL (ref 0.61–1.24)
GFR calc Af Amer: >60 mL/min (ref >60)
GFR calc non Af Amer: >60 mL/min (ref >60)
Glucose, Bld: 111 mg/dL — ABNORMAL HIGH (ref 70–99)
Potassium: 4.1 mmol/L (ref 3.5–5.1)
Sodium: 136 mmol/L (ref 135–145)

## 2019-01-24 LAB — ACID FAST SMEAR (AFB, MYCOBACTERIA): Acid Fast Smear: NEGATIVE

## 2019-01-24 MED ORDER — OXYCODONE HCL 5 MG PO TABS: 5.0000 mg | ORAL_TABLET | Freq: Four times a day (QID) | ORAL | 0 refills | Status: AC | PRN

## 2019-01-24 MED ORDER — METHOCARBAMOL 500 MG PO TABS: 1000.0000 mg | ORAL_TABLET | Freq: Three times a day (TID) | ORAL | 0 refills | Status: AC | PRN

## 2019-01-24 MED ORDER — SULFAMETHOXAZOLE-TRIMETHOPRIM 800-160 MG PO TABS: 1.0000 | ORAL_TABLET | Freq: Two times a day (BID) | ORAL | 0 refills | Status: AC

## 2019-01-24 MED ORDER — SULFAMETHOXAZOLE-TRIMETHOPRIM 800-160 MG PO TABS
1.0000 | ORAL_TABLET | Freq: Two times a day (BID) | ORAL | Status: DC
  Administered 2019-01-24: 1 via ORAL
  Filled 2019-01-24: qty 1

## 2019-01-24 MED ORDER — ACETAMINOPHEN 500 MG PO TABS: 1000.0000 mg | ORAL_TABLET | Freq: Four times a day (QID) | ORAL | Status: AC | PRN

## 2019-01-24 NOTE — Progress Notes (Signed)
Pharmacy Antibiotic Note  Brian Richardson is a 24 y.o. male admitted on 01/20/2019 with right gluteal and thigh abscess with necrotizing fascitis s/p I&D.  Pharmacy has been consulted for Vancomycin dosing. WBC trending down. Renal function normalized.   AUC this AM is therapeutic   Plan: Cont vancomycin 1250 mg IV q8h Re-check levels as needed  Height: 6' (182.9 cm) Weight: 235 lb (106.6 kg) IBW/kg (Calculated) : 77.6  Temp (24hrs), Avg:98.4 F (36.9 C), Min:97.8 F (36.6 C), Max:99.1 F (37.3 C)  Recent Labs  Lab 01/20/19 1530 01/20/19 1757 01/20/19 1950 01/22/19 0234 01/23/19 0709 01/23/19 1326 01/24/19 0408  WBC 24.7*  --   --   --  17.7*  --   --   CREATININE 1.39*  --   --  1.08 0.91  --  0.91  LATICACIDVEN 2.2* 2.9* 2.1*  --   --   --   --   VANCOTROUGH  --   --   --   --   --  31* 11*  VANCOPEAK  --   --   --   --  31  --   --     Estimated Creatinine Clearance: 156.6 mL/min (by C-G formula based on SCr of 0.91 mg/dL).    No Known Allergies  Abran Duke, PharmD, BCPS Clinical Pharmacist Phone: [phone]

## 2019-01-24 NOTE — Discharge Summary (Signed)
[facility] Discharge Summary   Patient ID: Brian Richardson MRN: [id number] DOB/AGE: [birth year] 23 y.o.  Admit date: 01/20/2019 Discharge date: 01/24/2019  Admitting Diagnosis: Cellulitis of R buttock Cellulitis of RLE  Discharge Diagnosis Cellulitis of R buttock Cellulitis of RLE  Consultants Orthopedic surgery  Imaging: No results found.  Procedures Dr. Cliffton Asters (01/21/19) - Incision and drainage of right gluteal abscess Dr. Dion Saucier (01/21/19) - excisional debridement right thigh, extending down to lateral aspect of knee, VAC application Dr. Eulah Pont (01/23/19) - incision and drainage of right thigh  Hospital Course:  Patient is a 24 year old male who [facility] with right buttock tenderness and fullness. He was seen at urgent care 1/12 and treated for right knee cellulitis and abscess with I&D and sent home on amoxicillin. Changed to bactrim 1/14.  Workup showed right buttock abscess and RLE abscess. Orthopedic surgery consulted.  Patient was admitted and underwent procedure listed above.  Tolerated procedure well and was transferred to the floor.  Diet was advanced as tolerated.    On 01/24/19, the patient was voiding well, tolerating diet, ambulating well, pain well controlled, vital signs stable, wounds stable and felt stable for discharge home.  Patient will follow up in our office in 1 weeks and knows to call with questions or concerns.  He will call to confirm appointment date/time.    I have personally looked this patient up in the [place] Controlled Substance Database and reviewed their medications.   Allergies as of 01/24/2019   No Known Allergies     Medication List    STOP taking these medications   amoxicillin-clavulanate 875-125 MG tablet Commonly known as: AUGMENTIN   HYDROcodone-acetaminophen 5-325 MG tablet Commonly known as: NORCO/VICODIN     TAKE these medications   acetaminophen 500 MG tablet Commonly known as: TYLENOL Take 2 tablets (1,000 mg  total) by mouth every 6 (six) hours as needed for mild pain or fever.   albuterol 108 (90 Base) MCG/ACT inhaler Commonly known as: VENTOLIN HFA Inhale 1-2 puffs into the lungs every 6 (six) hours as needed for wheezing or shortness of breath.   fluticasone-salmeterol 115-21 MCG/ACT inhaler Commonly known as: Advair HFA Inhale 2 puffs into the lungs 2 (two) times daily.   ibuprofen 800 MG tablet Commonly known as: ADVIL Take 1 tablet (800 mg total) by mouth every 8 (eight) hours as needed for fever or moderate pain.   methocarbamol 500 MG tablet Commonly known as: ROBAXIN Take 2 tablets (1,000 mg total) by mouth every 8 (eight) hours as needed for muscle spasms.   oxyCODONE 5 MG immediate release tablet Commonly known as: Oxy IR/ROXICODONE Take 1-2 tablets (5-10 mg total) by mouth every 6 (six) hours as needed for moderate pain or severe pain (5mg  for moderate pain, 10mg  for severe pain).   sulfamethoxazole-trimethoprim 800-160 MG tablet Commonly known as: BACTRIM DS Take 1 tablet by mouth every 12 (twelve) hours for 10 days. What changed: when to take this        Follow-up Information    Schedule an appointment as soon as possible for a visit with , MD.   Specialty: Orthopedic Surgery Contact information: [address] [place] [address] [place] [phone]        Surgery, [place]. Go on 02/01/2019.   Specialty: General Surgery Why: Follow up appointment scheduled for 2:00 PM. Please arrive 30 min prior to appointment time. Bring photo ID and insurance information.  Contact information: [address] [place] [address] [phone]           Signed: Brigid Re, [facility] 01/24/2019, 8:30 AM Please see Amion for pager number during day hours 7:00am-4:30pm

## 2019-01-24 NOTE — Progress Notes (Signed)
Wound vac removed and replaced with Mepilex.

## 2019-01-24 NOTE — Progress Notes (Signed)
PT Cancellation Note  Patient Details Name: Brian Richardson MRN: [id number] DOB: August 21, 1995   Cancelled Treatment:    Reason Eval/Treat Not Completed: Patient declined, no reason specified patient politely declines PT, states he just went walking up and down the hall and feels very comfortable with mobility. Has some crutches at home he's used in the past and is comfortable with. The plan is for him to DC today and he has no concerns regarding mobility. Will continue to follow acutely although suspect he likely will not have significant acute rehab needs during this hospital stay.    Madelaine Etienne, DPT, PN1   Supplemental Physical Therapist [facility]    Pager [phone] Acute Rehab Office [phone]

## 2019-01-24 NOTE — Discharge Instructions (Signed)
How to Take a [employer] A sitz bath is a warm water bath that may be used to care for your rectum, genital area, or the area between your rectum and genitals (perineum). For a sitz bath, the water only comes up to your hips and covers your buttocks. A sitz bath may done at home in a bathtub or with a portable sitz bath that fits over the toilet. Your health care provider may recommend a sitz bath to help:  Relieve pain and discomfort after delivering a baby.  Relieve pain and itching from hemorrhoids or anal fissures.  Relieve pain after certain surgeries.  Relax muscles that are sore or tight. How to take a sitz bath Take 3-4 sitz baths a day, or as many as told by your health care provider. Bathtub sitz bath To take a sitz bath in a bathtub: 1. Partially fill a bathtub with warm water. The water should be deep enough to cover your hips and buttocks when you are sitting in the tub. 2. If your health care provider told you to put medicine in the water, follow his or her instructions. 3. Sit in the water. 4. Open the tub drain a little, and leave it open during your bath. 5. Turn on the warm water again, enough to replace the water that is draining out. Keep the water running throughout your bath. This helps keep the water at the right level and the right temperature. 6. Soak in the water for 15-20 minutes, or as long as told by your health care provider. 7. When you are done, be careful when you stand up. You may feel dizzy. 8. After the sitz bath, pat yourself dry. Do not rub your skin to dry it.  Over-the-toilet sitz bath To take a sitz bath with an over-the-toilet basin: 1. Follow the manufacturer's instructions. 2. Fill the basin with warm water. 3. If your health care provider told you to put medicine in the water, follow his or her instructions. 4. Sit on the seat. Make sure the water covers your buttocks and perineum. 5. Soak in the water for 15-20 minutes, or as long as told by  your health care provider. 6. After the sitz bath, pat yourself dry. Do not rub your skin to dry it. 7. Clean and dry the basin between uses. 8. Discard the basin if it cracks, or according to the manufacturer's instructions. Contact a health care provider if:  Your symptoms get worse. Do not continue with sitz baths if your symptoms get worse.  You have new symptoms. If this happens, do not continue with sitz baths until you talk with your health care provider. Summary  A sitz bath is a warm water bath in which the water only comes up to your hips and covers your buttocks.  A sitz bath may help relieve itching, relieve pain, and relax muscles that are sore or tight in the lower part of your body, including your genital area.  Take 3-4 sitz baths a day, or as many as told by your health care provider. Soak in the water for 15-20 minutes.  Do not continue with sitz baths if your symptoms get worse. This information is not intended to replace advice given to you by your health care provider. Make sure you discuss any questions you have with your health care provider. Document Revised: 05/22/2018 Document Reviewed: 12/23/2016 Elsevier Patient Education  [place].   Incision and Drainage, Care After This sheet gives you information about  how to care for yourself after your procedure. Your health care provider may also give you more specific instructions. If you have problems or questions, contact your health care provider. What can I expect after the procedure? After the procedure, it is common to have:  Pain or discomfort around the incision site.  Blood, fluid, or pus (drainage) from the incision.  Redness and firm skin around the incision site. Follow these instructions at home: Medicines  Take over-the-counter and prescription medicines only as told by your health care provider.  If you were prescribed an antibiotic medicine, use or take it as told by your health care  provider. Do not stop using the antibiotic even if you start to feel better. Wound care Follow instructions from your health care provider about how to take care of your wound. Make sure you:  Wash your hands with soap and water before and after you change your bandage (dressing). If soap and water are not available, use hand sanitizer.  Change your dressing and packing as told by your health care provider. ? If your dressing is dry or stuck when you try to remove it, moisten or wet the dressing with saline or water so that it can be removed without harming your skin or tissues. ? If your wound is packed, leave it in place until your health care provider tells you to remove it. To remove the packing, moisten or wet the packing with saline or water so that it can be removed without harming your skin or tissues.  Leave stitches (sutures), skin glue, or adhesive strips in place. These skin closures may need to stay in place for 2 weeks or longer. If adhesive strip edges start to loosen and curl up, you may trim the loose edges. Do not remove adhesive strips completely unless your health care provider tells you to do that. Check your wound every day for signs of infection. Check for:  More redness, swelling, or pain.  More fluid or blood.  Warmth.  Pus or a bad smell. If you were sent home with a drain tube in place, follow instructions from your health care provider about:  How to empty it.  How to care for it at home.  General instructions  Rest the affected area.  Do not take baths, swim, or use a hot tub until your health care provider approves. Ask your health care provider if you may take showers. You may only be allowed to take sponge baths.  Return to your normal activities as told by your health care provider. Ask your health care provider what activities are safe for you. Your health care provider may put you on activity or lifting restrictions.  The incision will continue to  drain. It is normal to have some clear or slightly bloody drainage. The amount of drainage should lessen each day.  Do not apply any creams, ointments, or liquids unless you have been told to by your health care provider.  Keep all follow-up visits as told by your health care provider. This is important. Contact a health care provider if:  Your cyst or abscess returns.  You have a fever or chills.  You have more redness, swelling, or pain around your incision.  You have more fluid or blood coming from your incision.  Your incision feels warm to the touch.  You have pus or a bad smell coming from your incision.  You have red streaks above or below the incision site. Get help right away  if:  You have severe pain or bleeding.  You cannot eat or drink without vomiting.  You have decreased urine output.  You become short of breath.  You have chest pain.  You cough up blood.  The affected area becomes numb or starts to tingle. These symptoms may represent a serious problem that is an emergency. Do not wait to see if the symptoms will go away. Get medical help right away. Call your local emergency services (911 in the [place] Do not drive yourself to the hospital. Summary  After this procedure, it is common to have fluid, blood, or pus coming from the surgery site.  Follow all home care instructions. You will be told how to take care of your incision, how to check for infection, and how to take medicines.  If you were prescribed an antibiotic medicine, take it as told by your health care provider. Do not stop taking the antibiotic even if you start to feel better.  Contact a health care provider if you have increased redness, swelling, or pain around your incision. Get help right away if you have chest pain, you vomit, you cough up blood, or you have shortness of breath.  Keep all follow-up visits as told by your health care provider. This is important. This information is not  intended to replace advice given to you by your health care provider. Make sure you discuss any questions you have with your health care provider. Document Revised: 11/21/2017 Document Reviewed: 11/21/2017 Elsevier Patient Education  2020 [employer] Inc.     Right Leg / Thigh: You may bear weight as tolerated.  Elevate at all times when not walking. Maintain dressing and keep dry until follow up in the office with Dr. Eulah Pont (approximately 1 week). Keep ace wrap on your entire leg to reduce swelling. Take antibiotics as instructed

## 2019-01-24 NOTE — Progress Notes (Signed)
    Subjective: Patient reports pain as mild, controlled, significantly improved.  Tolerating diet.  Urinating.   No CP, SOB.  Mobilized well with therapy.  Patient hoping to go home.  Objective:   VITALS:   Vitals:   01/23/19 1945 01/24/19 0042 01/24/19 0608 01/24/19 0813  BP: 125/61 134/72 133/62 128/61  Pulse: 93 79 79 83  Resp: 17 13 12 11   Temp: 99.1 F (37.3 C) 97.9 F (36.6 C) 97.7 F (36.5 C) 98.2 F (36.8 C)  TempSrc: Oral Axillary Oral Oral  SpO2: 98% 97% 99% 98%  Weight:      Height:       CBC Latest Ref Rng & Units 01/24/2019 01/23/2019 01/20/2019  WBC 4.0 - 10.5 K/uL 19.9(H) 17.7(H) 24.7(H)  Hemoglobin 13.0 - 17.0 g/dL 01/22/2019 16.1 17.7(H)  Hematocrit 39.0 - 52.0 % 41.6 44.2 53.1(H)  Platelets 150 - 400 K/uL 362 335 324   BMP Latest Ref Rng & Units 01/24/2019 01/23/2019 01/22/2019  Glucose 70 - 99 mg/dL 01/24/2019) 045(W) 098(J)  BUN 6 - 20 mg/dL 10 9 10   Creatinine 0.61 - 1.24 mg/dL 191(Y 7.82  Sodium 135 - 145 mmol/L 136 138 136  Potassium 3.5 - 5.1 mmol/L 4.1 4.0 3.7  Chloride 98 - 111 mmol/L 105 103 101  CO2 22 - 32 mmol/L 24 24 25   Calcium 8.9 - 10.3 mg/dL 8.2(L) 8.5(L) 7.9(L)   Intake/Output      01/19 0701 - 01/20 0700 01/20 0701 - 01/21 0700   I.V. (mL/kg) 400 (3.8)    IV Piggyback     Total Intake(mL/kg) 400 (3.8)    Urine (mL/kg/hr) 2500 (1)    Drains     Other     Blood 25    Total Output 2525    Net -2125            Physical Exam: General: NAD.  Upright in bed.  Calm, conversant. Resp: No increased wob Cardio: regular rate and rhythm ABD soft Neurologically intact MSK RLE: Ace wrap and dressings clean dry and intact Incisional VAC in place.  No drainage in canister. Neurovascularly intact Sensation intact distally Feet warm Dorsiflexion/Plantar flexion, EHL/FHL intact   Assessment: 1 Day Post-Op  S/P Procedure(s) (LRB): REPEAT IRRIGATION AND DEBRIDEMENT RIGHT LOWER EXTREMITY (Right) by Dr. 2/20. Murphy on  01/23/2019  Principal Problem:   Necrotizing fasciitis (HCC), right thigh Active Problems:   Abscess of right leg   Right lateral thigh infection, status post repeat I/D, incisional VAC placement  Ready for discharge from an orthopedic perspective Doing very well AFVSN I/D yesterday did not reveal purulence.  Incision closed well with nylon suture.  Significantly decreased swelling in thigh.   Plan: Incentive Spirometry Elevate when not mobilizing Continue antibiotic therapy after discharge  Weightbearing: WBAT RLE Insicional and dressing care: Remove incisional VAC at time of discharge and place Mepilex dressing (in room).  Replace Ace wrap for compression. Showering: Keep dressing dry Follow - up plan: 1 week in the office with Dr. [id number]  Dispo:  Fit for discharge from an orthopedic perspective.  Follow-up with Dr. Jewel Baize in the office in 1 week.   01/25/2019 Martensen III, PA-C 01/24/2019, 8:26 AM

## 2019-01-24 NOTE — Plan of Care (Signed)

## 2019-01-24 NOTE — Progress Notes (Signed)
Patient in a stable condition, discharge education reviewed with patient he verbalized understanding, tele dc ccmd notified, patient belongings at bedside, patient to be transported home by his family.

## 2019-01-24 NOTE — Progress Notes (Signed)
[facility] Progress Note  1 Day Post-Op  Subjective: CC: wants to go home Patient anxious to get out of the hospital. Denies pain in R buttock and reports pain much better in RLE. Able to lift RLE. Already has done sitz bath this AM.   Objective: Vital signs in last 24 hours: Temp:  [97.7 F (36.5 C)-99.1 F (37.3 C)] 97.7 F (36.5 C) (01/20 0608) Pulse Rate:  [79-106] 79 (01/20 0608) Resp:  [12-18] 12 (01/20 0608) BP: (125-144)/(50-72) 133/62 (01/20 0608) SpO2:  [94 %-99 %] 99 % (01/20 0608) Last BM Date: (days per patient)  Intake/Output from previous day: 01/19 0701 - 01/20 0700 In: 400 [I.V.:400] Out: 2525 [Urine:2500; Blood:25] Intake/Output this shift: No intake/output data recorded.  PE: Gen:  Alert, NAD, pleasant Card:  Regular rate and rhythm Pulm:  Normal effort, clear to auscultation bilaterally Abd: Soft, non-tender, non-distended Ext: RLE in ACE wrap with VAC present, R toes WWP, R buttock without erythema or induration, R buttock non-tender with penrose drains present, minimal purulent drainage Skin: warm and dry, no rashes  Psych: A&Ox3   Lab Results:  Recent Labs    01/23/19 0709  WBC 17.7*  HGB 14.6  HCT 44.2  PLT 335   BMET Recent Labs    01/23/19 0709 01/24/19 0408  NA 138 136  K 4.0 4.1  CL 103 105  CO2 24 24  GLUCOSE 127* 111*  BUN 9 10  CREATININE 0.91 0.91  CALCIUM 8.5* 8.2*   PT/INR No results for input(s): LABPROT, INR in the last 72 hours. CMP     Component Value Date/Time   NA 136 01/24/2019 0408   K 4.1 01/24/2019 0408   CL 105 01/24/2019 0408   CO2 24 01/24/2019 0408   GLUCOSE 111 (H) 01/24/2019 0408   BUN 10 01/24/2019 0408   CREATININE 0.91 01/24/2019 0408   CALCIUM 8.2 (L) 01/24/2019 0408   PROT 7.9 01/20/2019 1530   ALBUMIN 2.9 (L) 01/20/2019 1530   AST 31 01/20/2019 1530   ALT 28 01/20/2019 1530   ALKPHOS 124 01/20/2019 1530   BILITOT 1.0 01/20/2019 1530   GFRNONAA >60 01/24/2019 0408   GFRAA >60 01/24/2019 0408   Lipase     Component Value Date/Time   LIPASE 25 10/18/2009 2121       Studies/Results: No results found.  Anti-infectives: Anti-infectives (From admission, onward)   Start     Dose/Rate Route Frequency Ordered Stop   01/21/19 0845  vancomycin (VANCOCIN) powder  Status:  Discontinued       As needed 01/21/19 0924 01/21/19 0951   01/21/19 0600  ceFAZolin (ANCEF) IVPB 2g/100 mL premix     2 g 200 mL/hr over 30 Minutes Intravenous On call to O.R. 01/20/19 2149 01/21/19 0802   01/21/19 0000  clindamycin (CLEOCIN) IVPB 900 mg  Status:  Discontinued     900 mg 100 mL/hr over 30 Minutes Intravenous Every 8 hours 01/20/19 1951 01/22/19 1342   01/20/19 2200  piperacillin-tazobactam (ZOSYN) IVPB 3.375 g  Status:  Discontinued     3.375 g 12.5 mL/hr over 240 Minutes Intravenous Every 8 hours 01/20/19 1534 01/22/19 1342   01/20/19 2200  piperacillin-tazobactam (ZOSYN) IVPB 3.375 g  Status:  Discontinued     3.375 g 12.5 mL/hr over 240 Minutes Intravenous Every 8 hours 01/20/19 1951 01/20/19 2019   01/20/19 1545  piperacillin-tazobactam (ZOSYN) IVPB 3.375 g     3.375 g 100 mL/hr over 30 Minutes Intravenous  Once  01/20/19 1534 01/20/19 1730   01/20/19 1530  vancomycin (VANCOREADY) IVPB 1250 mg/250 mL     1,250 mg 166.7 mL/hr over 90 Minutes Intravenous Every 8 hours 01/20/19 1526     01/20/19 1530  clindamycin (CLEOCIN) IVPB 600 mg     600 mg 100 mL/hr over 30 Minutes Intravenous  Once 01/20/19 1529 01/20/19 1730       Assessment/Plan Right thigh abscess with necrotizing fasciitis - s/pExcisional debridement, right thigh, extending down to the lateral aspect of the kneeand application of 14 cm wound VAC- Dr. Dion Saucier - 01/21/2019.  - went back to OR with ortho yesterday  - VAC per ortho - Continue abx   Rightgluteal abscess - s/pIncision and drainage of right gluteal abscess, sharp debridement of necrotic subcutaneous fat, 4cm2, placement of 2  penrose drains through counter incisions - Dr. Cliffton Asters - 01/21/2019 - POD #3 - MRSA from the anaerobic wound culture - discussed with pharmacy, patient will need to go home on bactrim  - continue sitz baths  Steroid Use  FEN -Reg diet  VTE -SCDs, Lovenox ID -Clinda/1/16 - 1/18;Zosyn 1/16-1/18; vancomycin 1/16 >>   Plan: Stable for discharge from a general surgery standpoint. Will discuss dispo with ortho  LOS: 4 days    Wells Guiles , [facility] 01/24/2019, 8:10 AM Please see Amion for pager number during day hours 7:00am-4:30pm

## 2019-01-25 LAB — CULTURE, BLOOD (ROUTINE X 2)
Culture: NO GROWTH
Culture: NO GROWTH
Special Requests: ADEQUATE

## 2019-01-25 NOTE — Op Note (Signed)
01/23/2019  6:27 AM  PATIENT:  Brian Richardson    PRE-OPERATIVE DIAGNOSIS:  NECROTISING FASCITITIS OF RIGHT THIGH  POST-OPERATIVE DIAGNOSIS:  Same  PROCEDURE:  REPEAT IRRIGATION AND DEBRIDEMENT RIGHT LOWER EXTREMITY  SURGEON:  Sheral Apley, MD  ASSISTANT: Aquilla Hacker, PA-C, he was present and scrubbed throughout the case, critical for completion in a timely fashion, and for retraction, instrumentation, and closure.   ANESTHESIA:   gen  PREOPERATIVE INDICATIONS:  Brian Richardson is a  24 y.o. male with a diagnosis of NECROTISING FASCITITIS OF RIGHT THIGH who failed conservative measures and elected for surgical management.    The risks benefits and alternatives were discussed with the patient preoperatively including but not limited to the risks of infection, bleeding, nerve injury, cardiopulmonary complications, the need for revision surgery, among others, and the patient was willing to proceed.  OPERATIVE IMPLANTS: none  OPERATIVE FINDINGS: no purulence  BLOOD LOSS: min  COMPLICATIONS: none  TOURNIQUET TIME: noe  OPERATIVE PROCEDURE:  Patient was identified in the preoperative holding area and site was marked by me He was transported to the operating theater and placed on the table in supine position taking care to pad all bony prominences. After a preincinduction time out anesthesia was induced. The right nower extremity was prepped and draped in normal sterile fashion and a pre-incision timeout was performed. He received ancef for preoperative antibiotics.   I removed his previous wound VAC and stitches.  I examined all tissue in his wound I did not see any additional purulence.  I probed proximally I was not able to express any additional pockets of purulent fluid.  After this abscess was confirmed to be completely drained I then thoroughly irrigated with 3 L of saline  I debrided some additional necrotic tissue with pickups this was excisional into the fascia.  I then  irrigated with another 3 L of saline  I performed a complex closure of his 18 cm roughly of incision.  I then placed a wound VAC incisional VAC.  He was awoken and taken the PACU in stable condition  POST OPERATIVE PLAN: WBAT, Abx and dvt px per primary

## 2019-01-26 LAB — AEROBIC/ANAEROBIC CULTURE W GRAM STAIN (SURGICAL/DEEP WOUND)

## 2019-02-27 ENCOUNTER — Encounter (HOSPITAL_COMMUNITY): Payer: Self-pay

## 2019-02-27 ENCOUNTER — Other Ambulatory Visit: Payer: Self-pay

## 2019-02-27 ENCOUNTER — Ambulatory Visit (HOSPITAL_COMMUNITY)
Admission: EM | Admit: 2019-02-27 | Discharge: 2019-02-27 | Disposition: A | Discharge status: Home / Self Care | Payer: Commercial Managed Care - PPO | Attending: Urgent Care | Admitting: Urgent Care

## 2019-02-27 DIAGNOSIS — L03317 Cellulitis of buttock: Secondary | ICD-10-CM | POA: Diagnosis not present

## 2019-02-27 DIAGNOSIS — A4902 Methicillin resistant Staphylococcus aureus infection, unspecified site: Secondary | ICD-10-CM

## 2019-02-27 MED ORDER — SULFAMETHOXAZOLE-TRIMETHOPRIM 800-160 MG PO TABS: 1.0000 | ORAL_TABLET | Freq: Two times a day (BID) | ORAL | 0 refills | Status: AC

## 2019-02-27 NOTE — ED Triage Notes (Signed)
Pt reports having a painful bump in the middle of the buttocks x 1-2 days.

## 2019-02-27 NOTE — Discharge Instructions (Signed)
If you develop worsening pain, swelling, drainage then please return to our clinic within 2 days. Right now, there is no area amenable to incision and drainage. We will rely on the antibiotic, warm compresses. Use the compresses 3-5 times daily, 5-10 minutes each time. Do this for the next 3-5 days.

## 2019-02-27 NOTE — ED Provider Notes (Signed)
Brian Richardson   MRN: [id number] DOB: October 15, 1995  Subjective:   Brian Richardson is a 24 y.o. male presenting for 2-day history of acute onset upper buttock and redness.  Patient has significant history of MRSA.  Was recently hospitalized for necrotizing fasciitis at the end of January.  Culture showed resistance to multiple antibiotics but not Bactrim.  No current facility-administered medications for this encounter.  Current Outpatient Medications:  .  acetaminophen (TYLENOL) 500 MG tablet, Take 2 tablets (1,000 mg total) by mouth every 6 (six) hours as needed for mild pain or fever., Disp: , Rfl:  .  albuterol (PROVENTIL HFA;VENTOLIN HFA) 108 (90 BASE) MCG/ACT inhaler, Inhale 1-2 puffs into the lungs every 6 (six) hours as needed for wheezing or shortness of breath., Disp: 1 Inhaler, Rfl: 0 .  fluticasone-salmeterol (ADVAIR HFA) 115-21 MCG/ACT inhaler, Inhale 2 puffs into the lungs 2 (two) times daily., Disp: 1 Inhaler, Rfl: 0 .  ibuprofen (ADVIL) 800 MG tablet, Take 1 tablet (800 mg total) by mouth every 8 (eight) hours as needed for fever or moderate pain., Disp: 21 tablet, Rfl: 0 .  methocarbamol (ROBAXIN) 500 MG tablet, Take 2 tablets (1,000 mg total) by mouth every 8 (eight) hours as needed for muscle spasms., Disp: 60 tablet, Rfl: 0 .  oxyCODONE (OXY IR/ROXICODONE) 5 MG immediate release tablet, Take 1-2 tablets (5-10 mg total) by mouth every 6 (six) hours as needed for moderate pain or severe pain (5mg  for moderate pain, 10mg  for severe pain)., Disp: 20 tablet, Rfl: 0   No Known Allergies  Past Medical History:  Diagnosis Date  . Asthma   . Lazy eye, right   . Necrotizing fasciitis ([place]), right thigh 01/20/2019  . PONV (postoperative nausea and vomiting)      Past Surgical History:  Procedure Laterality Date  . APPLICATION OF WOUND VAC Right 01/21/2019   Procedure: Application Of Wound Vac;  Surgeon: Marchia Bond, MD;  Location: [facility];  Service: Orthopedics;  Laterality:  Right;  . EYE SURGERY     Right-for lazy eye  . FRACTURE SURGERY     Right Arm  . I & D EXTREMITY Right 01/21/2019   Procedure: IRRIGATION AND DEBRIDEMENT EXTREMITY;  Surgeon: Marchia Bond, MD;  Location: [facility];  Service: Orthopedics;  Laterality: Right;  . I & D EXTREMITY Right 01/23/2019   Procedure: REPEAT IRRIGATION AND DEBRIDEMENT RIGHT LOWER EXTREMITY;  Surgeon: Renette Butters, MD;  Location: [facility];  Service: Orthopedics;  Laterality: Right;  . INCISION AND DRAINAGE ABSCESS Right 01/21/2019   Procedure: Incision and Drainage of Gluteal Abscess with debridement of Necrotic Fat;  Surgeon: Marchia Bond, MD;  Location: [facility];  Service: Orthopedics;  Laterality: Right;    Family History  Problem Relation Age of Onset  . Healthy Mother   . Healthy Father     Social History   Tobacco Use  . Smoking status: Never Smoker  . Smokeless tobacco: Never Used  Substance Use Topics  . Alcohol use: Yes    Comment: occ  . Drug use: Never    ROS   Objective:   Vitals: BP 134/78 (BP Location: Right Arm)   Pulse 96   Temp 99 F (37.2 C) (Oral)   Resp 18   SpO2 96%   Physical Exam Constitutional:      General: He is not in acute distress.    Appearance: Normal appearance. He is well-developed and normal weight. He is not ill-appearing, toxic-appearing or diaphoretic.  HENT:     Head: Normocephalic and atraumatic.     Right Ear: External ear normal.     Left Ear: External ear normal.     Nose: Nose normal.     Mouth/Throat:     Pharynx: Oropharynx is clear.  Eyes:     General: No scleral icterus.       Right eye: No discharge.        Left eye: No discharge.     Extraocular Movements: Extraocular movements intact.     Pupils: Pupils are equal, round, and reactive to light.  Cardiovascular:     Rate and Rhythm: Normal rate.  Pulmonary:     Effort: Pulmonary effort is normal.  Musculoskeletal:     Cervical back: Normal range of motion.  Skin:      Neurological:      Mental Status: He is alert and oriented to person, place, and time.  Psychiatric:        Mood and Affect: Mood normal.        Behavior: Behavior normal.        Thought Content: Thought content normal.        Judgment: Judgment normal.      Assessment and Plan :   1. Cellulitis of buttock   2. MRSA infection     Start Bactrim to cover for cellulitis.  There is no area that appears to be abscess at this point, therefore incision and drainage not amenable.  We will have patient do warm compresses, Tylenol and ibuprofen for pain and inflammation.  Strict return to clinic and ER precautions. Counseled patient on potential for adverse effects with medications prescribed today, patient verbalized understanding.    Wallis Bamberg, [place] 02/27/19 [phone]

## 2019-03-07 LAB — ACID FAST CULTURE WITH REFLEXED SENSITIVITIES (MYCOBACTERIA): Acid Fast Culture: NEGATIVE

## 2021-10-07 IMAGING — CR DG FEMUR 2+V*R*
4 series · 4 of 4 positions shown · non-contrast
Comparison: None.

CLINICAL DATA: Right femoral pain.

EXAM:
RIGHT FEMUR 2 VIEWS

[femur ap (1 of 2)]
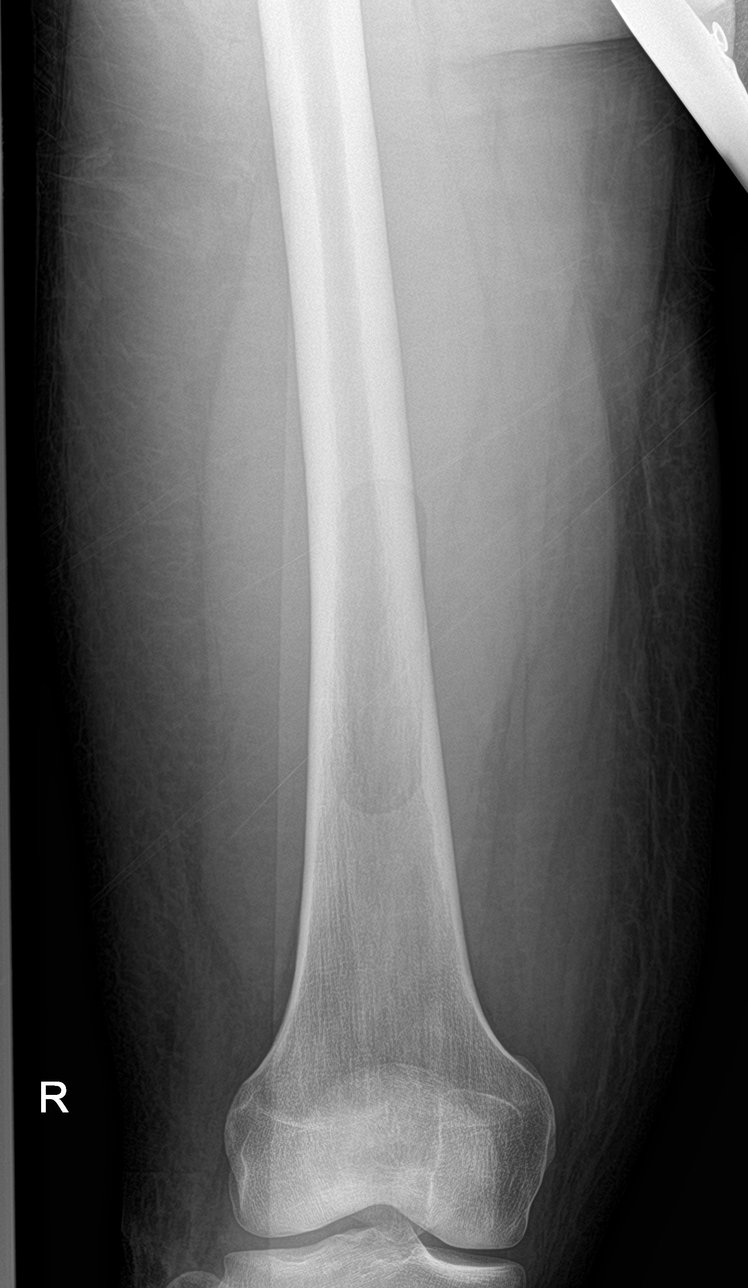

[femur ap (2 of 2)]
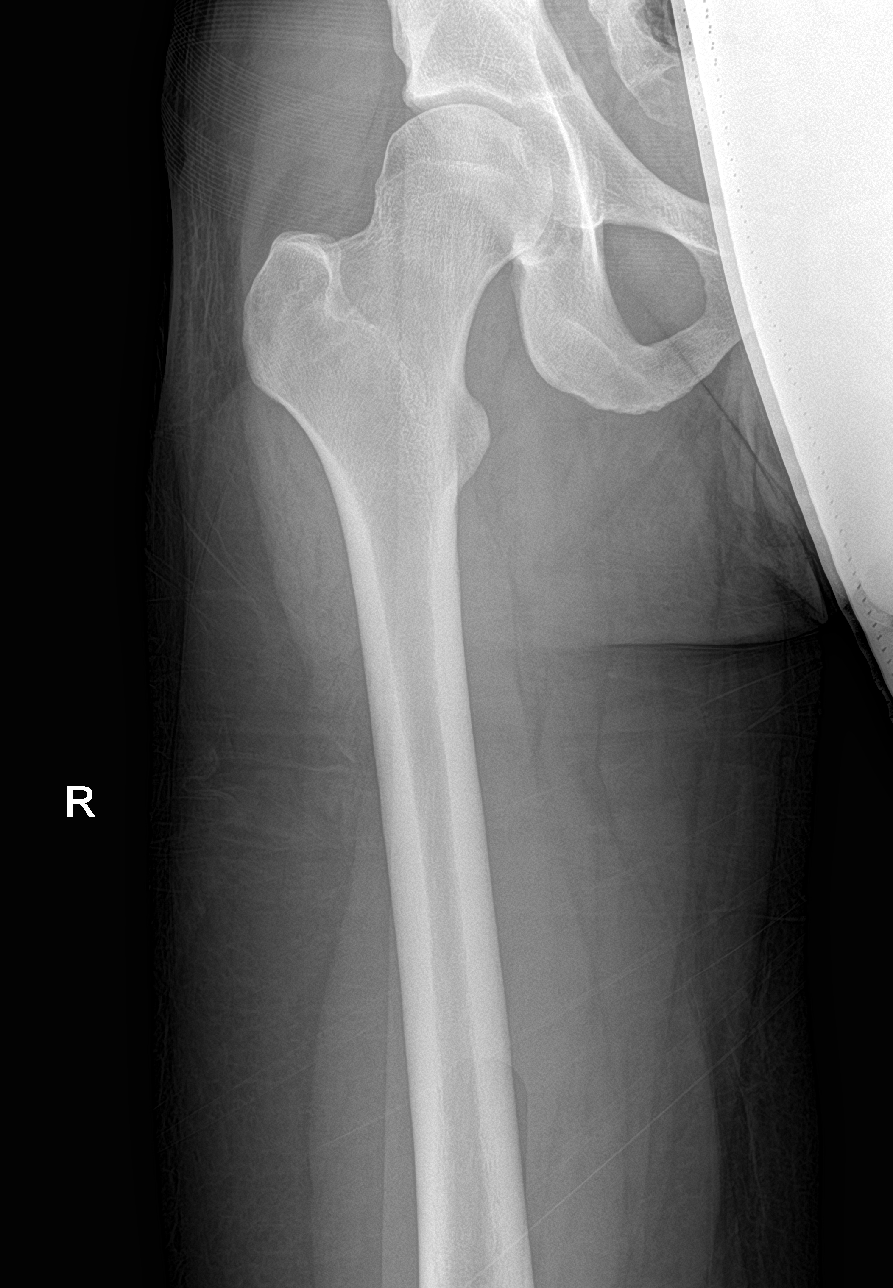

[femur lat (1 of 2)]
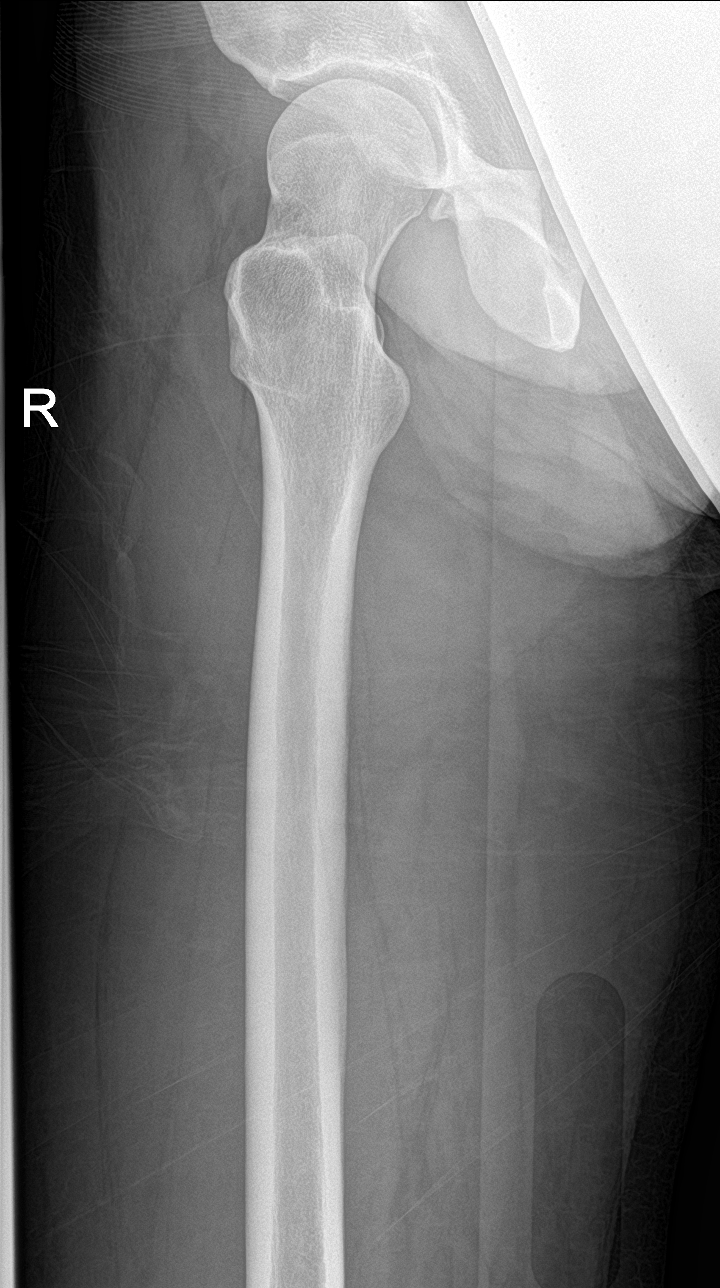

[femur lat (2 of 2)]
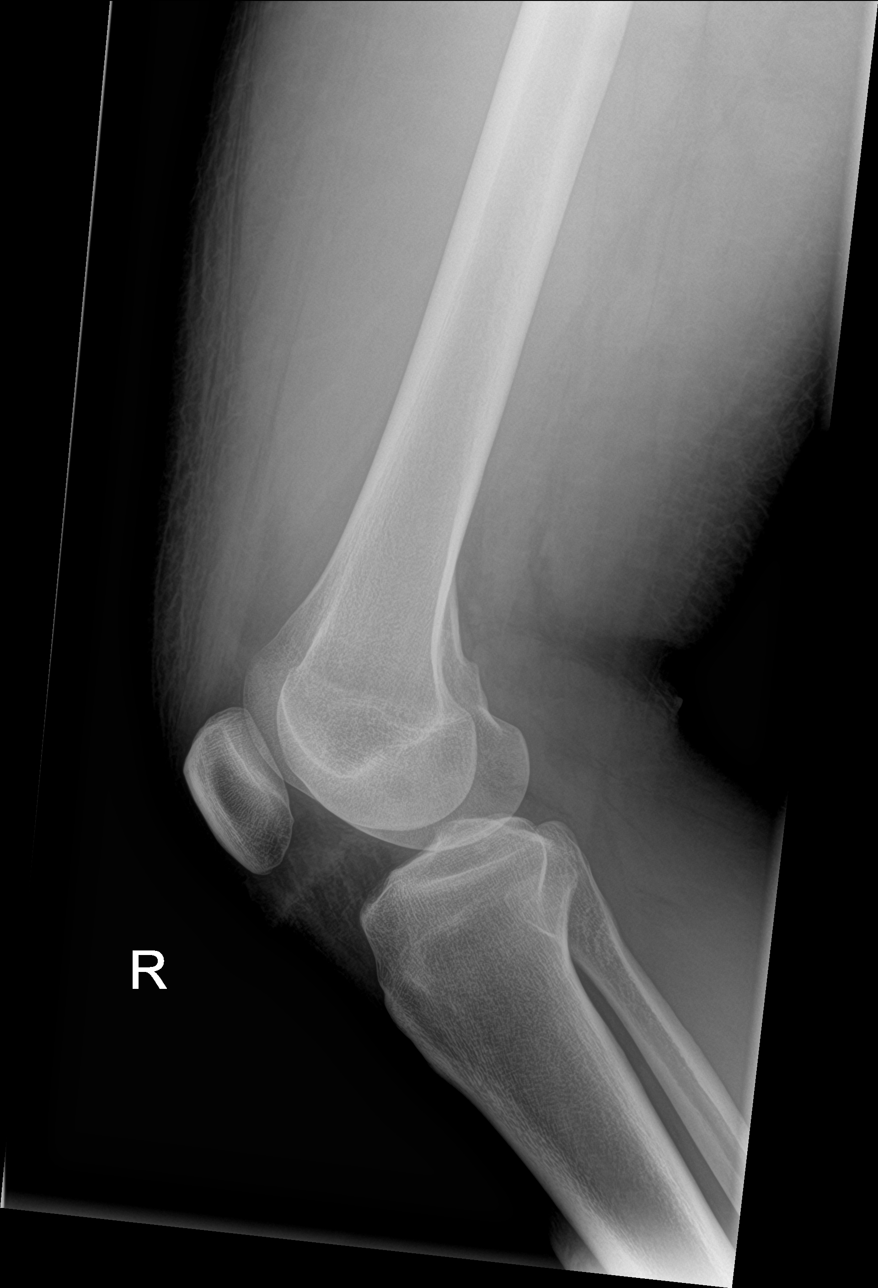

[4 of 4 positions shown; findings below may reference images not displayed]

FINDINGS: There is no evidence of fracture or other focal bone lesions. No
evidence of soft tissue emphysema. Diffuse subcutaneous soft tissue
swelling noted.
IMPRESSION: No acute fracture or dislocation identified about the right femur.

## 2021-10-07 IMAGING — CT CT FEMUR *R* W/ CM
3 of 5 series · 9 of 34 positions shown, 10 images · IV contrast (agent unspecified)
Comparison: X-ray 01/20/2019

CONTRAST:  100mL OMNIPAQUE IOHEXOL 300 MG/ML  SOLN

CLINICAL DATA: Right thigh swelling

EXAM:
CT OF THE LOWER RIGHT EXTREMITY WITH CONTRAST
TECHNIQUE: Multidetector CT imaging of the lower right extremity was performed
according to the standard protocol following intravenous contrast
administration.

[Series 5: extremity soft tissue · axial · 0.62mm/px · z∈[-652,-166]mm · 4 of 353 slices shown, 5 images]
[im 55/353  soft-tissue]
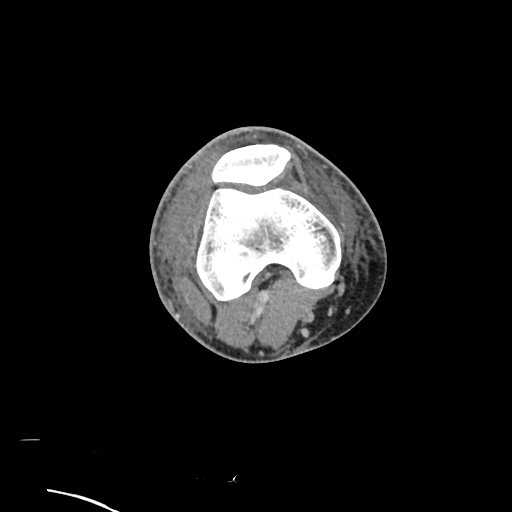
[im 55/353  bone]
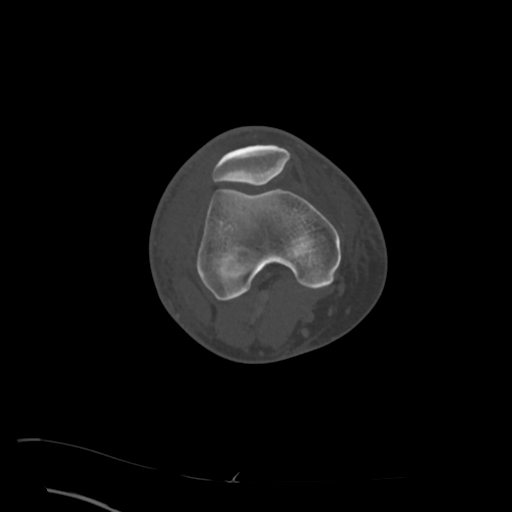
[im 136/353  bone]
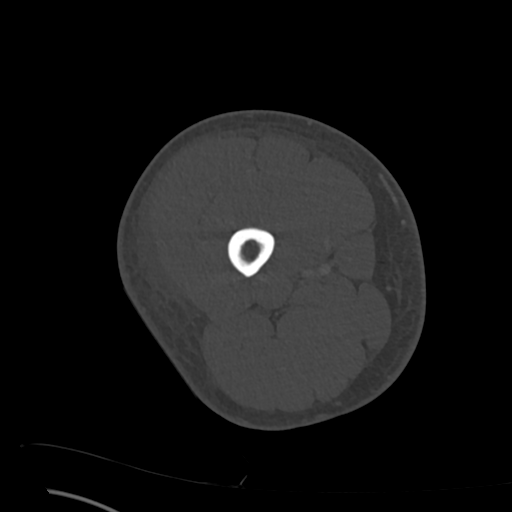
[im 217/353  bone]
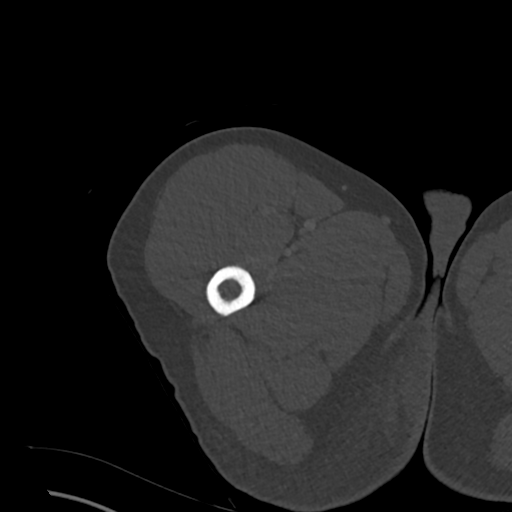
[im 298/353  bone]
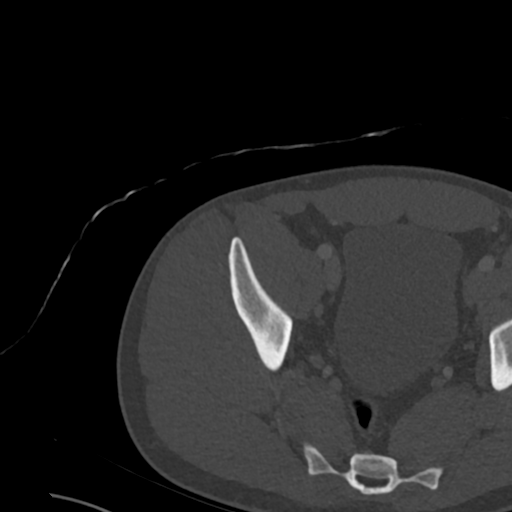

[Series 7: cor bone · coronal · 0.60mm/px · 1 of 143 slices shown]
[im 72/143  bone]
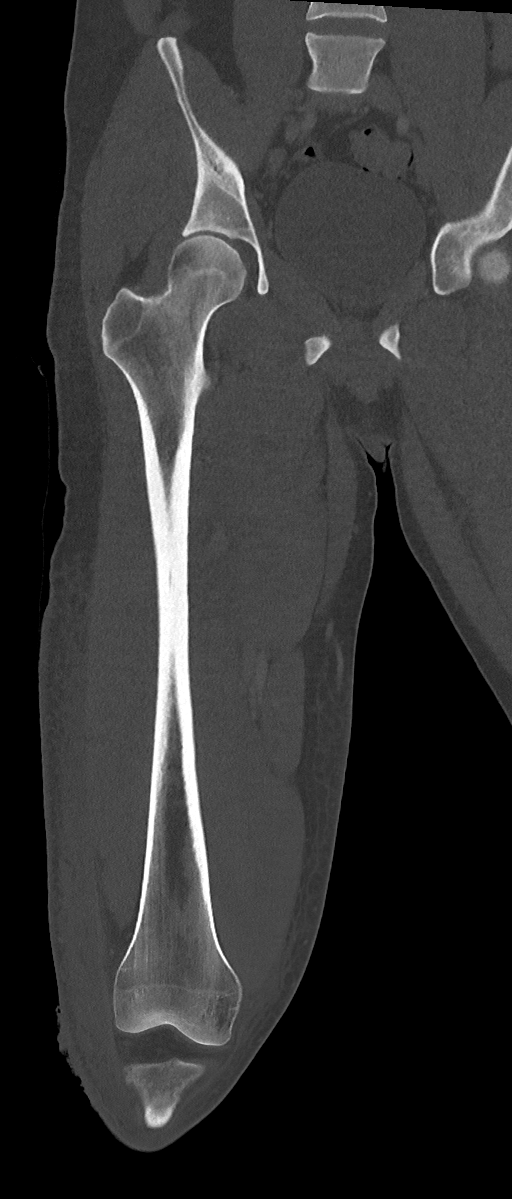

[Series 8: sag bone · sagittal · 0.56mm/px · 4 of 127 slices shown]
[im 26/127  bone]
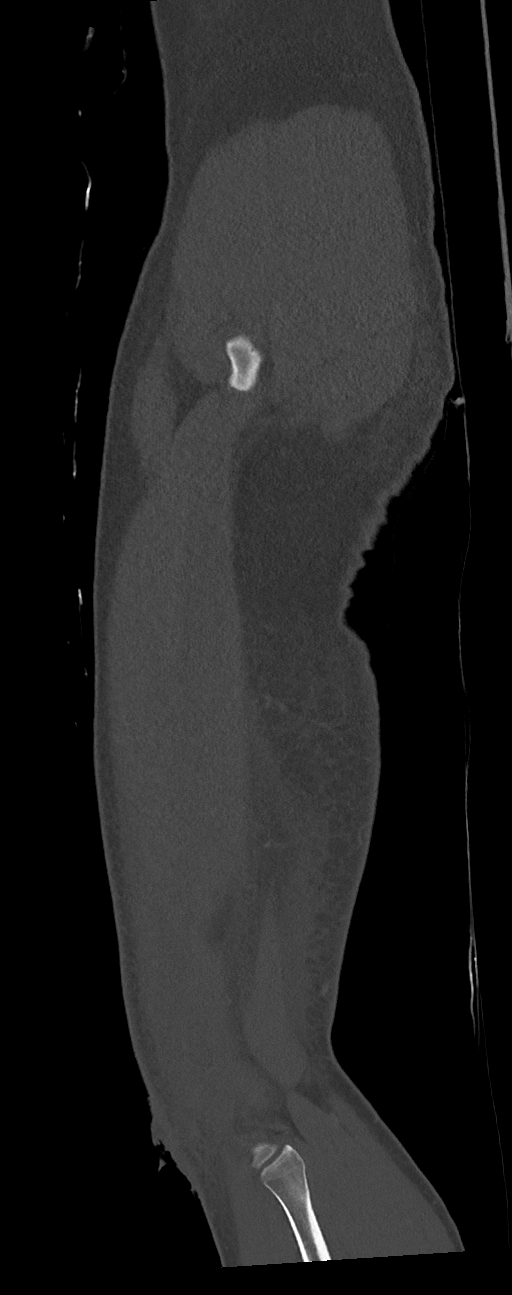
[im 51/127  bone]
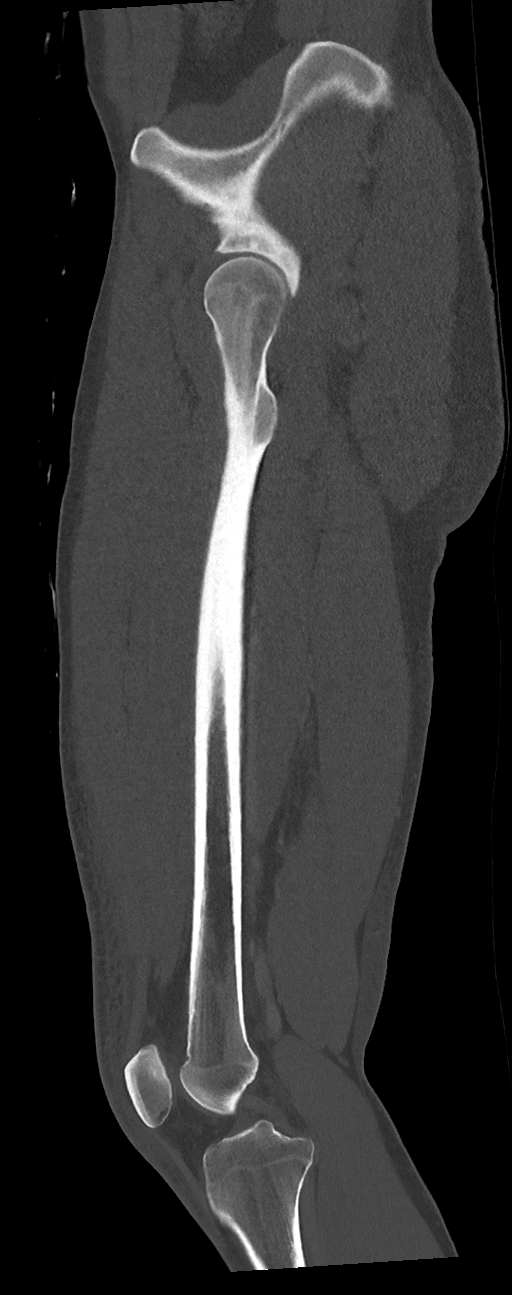
[im 76/127  bone]
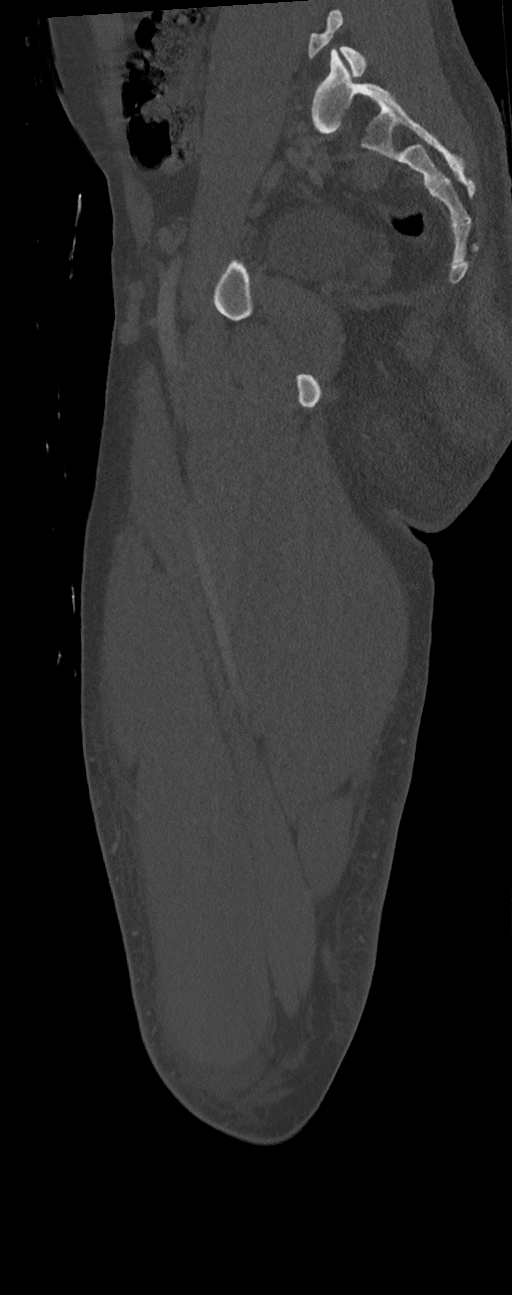
[im 101/127  bone]
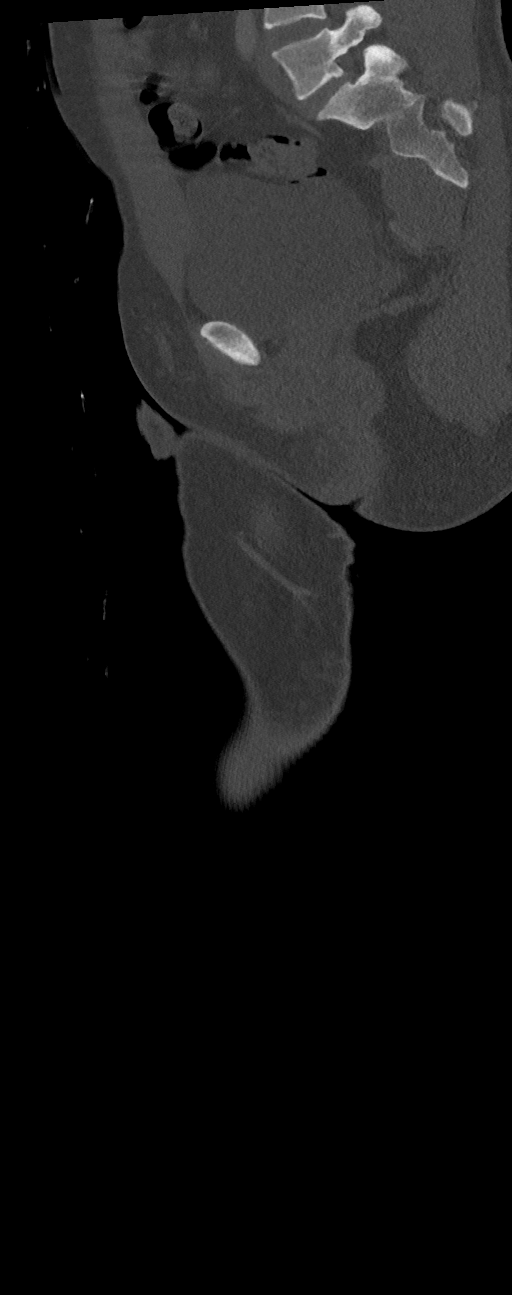

[9 of 34 positions shown; findings below may reference images not displayed]

FINDINGS: Bones/Joint/Cartilage

No acute fracture or dislocation. No periostitis or cortical
destruction. No lytic or sclerotic bony lesion. Joint spaces of the
right hip, right knee, SI joints, and pubic symphysis are maintained
without evidence of arthropathy. No appreciable hip joint effusion.
No knee joint effusion.

Ligaments

Suboptimally assessed by CT.

Muscles and Tendons

Normal muscle bulk without atrophy or fatty infiltration. No
intramuscular fluid collection or evidence of intramuscular abscess.
Tendinous structures about the lower extremity appear intact within
the limitations of CT.

Soft tissues

There is extensive induration and ill-defined fluid density within
the soft tissues of the right perineum and inferior gluteal region
measuring approximately 12 x 4.5 x 9 cm (series 5, image 123; series
9, image 99). There is no well-defined or rim enhancing wall. No
associated soft tissue gas. There is overlying skin thickening.
Inflammatory changes within this region do not appear to involve
scrotal soft tissues.

Overlying the anterolateral aspect of the distal femur just proximal
to the knee, there is extensive skin thickening with underlying
induration within the subcutaneous soft tissues. Extensive fluid
accumulates within the subcutaneous tissues of the mid to distal
femur, which is most pronounced anterior laterally. There is 1 tiny
focus of low attenuation seen within the soft tissues adjacent to
the lateral femoral condyle (series 5, image 298) which could
reflect a small focus of noninflamed normal fat versus a tiny locule
of gas. There are somewhat elongated areas of increased attenuation
within this region at the level of the distal femoral metaphysis
(series 5, image 285; series 9, image 53). There is no appreciable
deep fascial fluid collections.
IMPRESSION: 1. Extensive subcutaneous inflammatory changes with edema and fluid
accumulating within the subcutaneous tissues of the mid to distal
right femur, most pronounced anterolaterally. No drainable or rim
enhancing fluid component. There is one tiny focus of low
attenuation seen within the soft tissues adjacent to the lateral
femoral condyle, which could reflect a small focus of non-inflamed
normal fat versus a tiny locule of gas. Overall, findings are
suggestive of an aggressive soft tissue infection.
2. There are somewhat elongated areas of increased attenuation
within this region laterally at the level of the distal femoral
metaphysis possibly representing superficial thrombophlebitis.
3. Extensive induration and ill-defined fluid density within the
soft tissues of the right perineum and inferior gluteal region
measuring approximately 12 x 4.5 x 9 cm without well-defined or rim
enhancing wall, suggestive of phlegmonous change. No associated soft
tissue gas.
4. There are no appreciable deep fascial fluid collections. Please
note that necrotizing fasciitis is a clinical diagnosis and cannot
be excluded by imaging alone.

These results were called by telephone at the time of interpretation
on 01/20/2019 at [DATE] to provider GEZAHENG S MAN , who verbally
acknowledged these results.
